# Patient Record
Sex: Male | Born: 1965
Health system: Southern US, Community
[De-identification: ages and names within clinical notes are randomized; demographics above are authoritative.]

## PROBLEM LIST (undated history)

## (undated) DIAGNOSIS — I472 Ventricular tachycardia, unspecified: Secondary | ICD-10-CM

## (undated) DIAGNOSIS — I428 Other cardiomyopathies: Secondary | ICD-10-CM

## (undated) DIAGNOSIS — I495 Sick sinus syndrome: Secondary | ICD-10-CM

## (undated) DIAGNOSIS — Z95810 Presence of automatic (implantable) cardiac defibrillator: Secondary | ICD-10-CM

## (undated) HISTORY — DX: Presence of automatic (implantable) cardiac defibrillator: Z95.810

## (undated) HISTORY — DX: Ventricular tachycardia: I47.2

## (undated) HISTORY — DX: Sick sinus syndrome: I49.5

## (undated) HISTORY — DX: Other cardiomyopathies: I42.8

## (undated) HISTORY — PX: CARDIAC DEFIBRILLATOR PLACEMENT: SHX171

## (undated) HISTORY — DX: Ventricular tachycardia, unspecified: I47.20

---

## 1999-05-24 ENCOUNTER — Encounter: Payer: Self-pay | Admitting: Emergency Medicine

## 1999-05-24 ENCOUNTER — Inpatient Hospital Stay (HOSPITAL_COMMUNITY): Admission: EM | Admit: 1999-05-24 | Discharge: 1999-05-26 | Payer: Self-pay | Admitting: Emergency Medicine

## 1999-05-25 ENCOUNTER — Encounter: Payer: Self-pay | Admitting: Internal Medicine

## 1999-06-07 ENCOUNTER — Ambulatory Visit (HOSPITAL_COMMUNITY): Admission: RE | Admit: 1999-06-07 | Discharge: 1999-06-07 | Payer: Self-pay | Admitting: Internal Medicine

## 1999-06-07 ENCOUNTER — Encounter: Payer: Self-pay | Admitting: Internal Medicine

## 1999-12-07 ENCOUNTER — Encounter: Payer: Self-pay | Admitting: Internal Medicine

## 1999-12-07 ENCOUNTER — Ambulatory Visit (HOSPITAL_COMMUNITY): Admission: RE | Admit: 1999-12-07 | Discharge: 1999-12-08 | Payer: Self-pay | Admitting: Internal Medicine

## 1999-12-08 ENCOUNTER — Encounter: Payer: Self-pay | Admitting: Internal Medicine

## 2000-02-12 ENCOUNTER — Encounter: Payer: Self-pay | Admitting: Internal Medicine

## 2000-02-12 ENCOUNTER — Ambulatory Visit (HOSPITAL_COMMUNITY): Admission: RE | Admit: 2000-02-12 | Discharge: 2000-02-12 | Payer: Self-pay | Admitting: Internal Medicine

## 2002-10-31 ENCOUNTER — Ambulatory Visit (HOSPITAL_BASED_OUTPATIENT_CLINIC_OR_DEPARTMENT_OTHER): Admission: RE | Admit: 2002-10-31 | Discharge: 2002-10-31 | Payer: Self-pay | Admitting: Internal Medicine

## 2004-11-06 ENCOUNTER — Ambulatory Visit: Payer: Self-pay | Admitting: Internal Medicine

## 2004-11-07 ENCOUNTER — Ambulatory Visit: Payer: Self-pay | Admitting: Internal Medicine

## 2005-03-26 ENCOUNTER — Ambulatory Visit: Payer: Self-pay

## 2005-04-24 ENCOUNTER — Ambulatory Visit (HOSPITAL_COMMUNITY): Admission: RE | Admit: 2005-04-24 | Discharge: 2005-04-24 | Payer: Self-pay | Admitting: Internal Medicine

## 2005-05-03 ENCOUNTER — Ambulatory Visit (HOSPITAL_COMMUNITY): Admission: RE | Admit: 2005-05-03 | Discharge: 2005-05-03 | Payer: Self-pay | Admitting: Internal Medicine

## 2005-07-03 ENCOUNTER — Ambulatory Visit: Payer: Self-pay | Admitting: Internal Medicine

## 2006-01-17 ENCOUNTER — Ambulatory Visit: Payer: Self-pay | Admitting: Internal Medicine

## 2006-04-04 ENCOUNTER — Ambulatory Visit: Payer: Self-pay | Admitting: Internal Medicine

## 2006-05-06 ENCOUNTER — Ambulatory Visit: Payer: Self-pay | Admitting: Internal Medicine

## 2006-05-06 LAB — CONVERTED CEMR LAB
BUN: 12 mg/dL (ref 6–23)
Basophils Absolute: 0 10*3/uL (ref 0.0–0.1)
Basophils Relative: 0.6 % (ref 0.0–1.0)
CO2: 29 meq/L (ref 19–32)
Calcium: 9.2 mg/dL (ref 8.4–10.5)
Chloride: 103 meq/L (ref 96–112)
Creatinine, Ser: 1.2 mg/dL (ref 0.4–1.5)
Eosinophils Absolute: 0.2 10*3/uL (ref 0.0–0.6)
Eosinophils Relative: 2.6 % (ref 0.0–5.0)
GFR calc Af Amer: 85 mL/min
GFR calc non Af Amer: 70 mL/min
Glucose, Bld: 98 mg/dL (ref 70–99)
HCT: 44.7 % (ref 39.0–52.0)
Hemoglobin: 15.6 g/dL (ref 13.0–17.0)
INR: 1 (ref 0.9–2.0)
Lymphocytes Relative: 23.5 % (ref 12.0–46.0)
MCHC: 34.8 g/dL (ref 30.0–36.0)
MCV: 96.9 fL (ref 78.0–100.0)
Monocytes Absolute: 0.6 10*3/uL (ref 0.2–0.7)
Monocytes Relative: 8.5 % (ref 3.0–11.0)
Neutro Abs: 4.3 10*3/uL (ref 1.4–7.7)
Neutrophils Relative %: 64.8 % (ref 43.0–77.0)
Platelets: 257 10*3/uL (ref 150–400)
Potassium: 4.2 meq/L (ref 3.5–5.1)
Prothrombin Time: 12.3 s (ref 10.0–14.0)
RBC: 4.62 M/uL (ref 4.22–5.81)
RDW: 12.3 % (ref 11.5–14.6)
Sodium: 140 meq/L (ref 135–145)
WBC: 6.7 10*3/uL (ref 4.5–10.5)
aPTT: 29.4 s (ref 26.5–36.5)

## 2006-05-14 ENCOUNTER — Ambulatory Visit: Payer: Self-pay | Admitting: Internal Medicine

## 2006-05-15 ENCOUNTER — Observation Stay (HOSPITAL_COMMUNITY): Admission: RE | Admit: 2006-05-15 | Discharge: 2006-05-15 | Payer: Self-pay | Admitting: Internal Medicine

## 2006-06-03 ENCOUNTER — Ambulatory Visit: Payer: Self-pay

## 2006-09-16 ENCOUNTER — Ambulatory Visit: Payer: Self-pay | Admitting: Internal Medicine

## 2006-10-17 ENCOUNTER — Ambulatory Visit: Payer: Self-pay | Admitting: Internal Medicine

## 2007-01-16 ENCOUNTER — Ambulatory Visit: Payer: Self-pay | Admitting: Internal Medicine

## 2007-04-17 ENCOUNTER — Ambulatory Visit: Payer: Self-pay | Admitting: Internal Medicine

## 2007-07-17 ENCOUNTER — Ambulatory Visit: Payer: Self-pay | Admitting: Internal Medicine

## 2007-11-12 ENCOUNTER — Ambulatory Visit: Payer: Self-pay | Admitting: Internal Medicine

## 2008-02-12 ENCOUNTER — Ambulatory Visit: Payer: Self-pay | Admitting: Internal Medicine

## 2008-05-07 ENCOUNTER — Encounter: Payer: Self-pay | Admitting: Internal Medicine

## 2008-05-13 ENCOUNTER — Ambulatory Visit: Payer: Self-pay | Admitting: Internal Medicine

## 2008-08-12 ENCOUNTER — Ambulatory Visit: Payer: Self-pay | Admitting: Internal Medicine

## 2008-08-19 ENCOUNTER — Encounter: Payer: Self-pay | Admitting: Internal Medicine

## 2008-10-27 ENCOUNTER — Encounter: Payer: Self-pay | Admitting: Internal Medicine

## 2008-10-27 ENCOUNTER — Ambulatory Visit: Payer: Self-pay

## 2008-10-27 ENCOUNTER — Telehealth (INDEPENDENT_AMBULATORY_CARE_PROVIDER_SITE_OTHER): Payer: Self-pay | Admitting: *Deleted

## 2008-10-27 DIAGNOSIS — I472 Ventricular tachycardia, unspecified: Secondary | ICD-10-CM | POA: Insufficient documentation

## 2008-10-27 DIAGNOSIS — R079 Chest pain, unspecified: Secondary | ICD-10-CM | POA: Insufficient documentation

## 2008-10-28 ENCOUNTER — Encounter: Payer: Self-pay | Admitting: Cardiology

## 2008-10-28 ENCOUNTER — Ambulatory Visit: Payer: Self-pay

## 2008-11-02 ENCOUNTER — Telehealth: Payer: Self-pay | Admitting: Internal Medicine

## 2009-02-10 ENCOUNTER — Ambulatory Visit: Payer: Self-pay | Admitting: Internal Medicine

## 2009-02-28 ENCOUNTER — Encounter: Payer: Self-pay | Admitting: Internal Medicine

## 2009-04-26 ENCOUNTER — Ambulatory Visit: Payer: Self-pay | Admitting: Internal Medicine

## 2009-04-28 ENCOUNTER — Telehealth (INDEPENDENT_AMBULATORY_CARE_PROVIDER_SITE_OTHER): Payer: Self-pay | Admitting: *Deleted

## 2009-05-03 ENCOUNTER — Encounter: Payer: Self-pay | Admitting: Internal Medicine

## 2009-05-06 ENCOUNTER — Telehealth: Payer: Self-pay | Admitting: Internal Medicine

## 2009-05-06 LAB — CONVERTED CEMR LAB
ALT: 12 U/L
AST: 14 U/L
Albumin: 4.4 g/dL
Alkaline Phosphatase: 93 U/L
Bilirubin, Direct: 0.1 mg/dL
TSH: 0.57 u[IU]/mL
Total Bilirubin: 0.4 mg/dL
Total Protein: 6.7 g/dL

## 2009-05-17 ENCOUNTER — Ambulatory Visit: Payer: Self-pay

## 2009-05-17 ENCOUNTER — Encounter: Payer: Self-pay | Admitting: Internal Medicine

## 2009-05-17 ENCOUNTER — Ambulatory Visit (HOSPITAL_COMMUNITY): Admission: RE | Admit: 2009-05-17 | Discharge: 2009-05-17 | Payer: Self-pay | Admitting: Internal Medicine

## 2009-05-17 ENCOUNTER — Ambulatory Visit: Payer: Self-pay | Admitting: Cardiology

## 2009-05-20 ENCOUNTER — Telehealth: Payer: Self-pay | Admitting: Internal Medicine

## 2009-05-27 ENCOUNTER — Telehealth: Payer: Self-pay | Admitting: Internal Medicine

## 2009-07-28 ENCOUNTER — Ambulatory Visit: Payer: Self-pay | Admitting: Internal Medicine

## 2009-07-29 ENCOUNTER — Encounter (INDEPENDENT_AMBULATORY_CARE_PROVIDER_SITE_OTHER): Payer: Self-pay | Admitting: *Deleted

## 2009-08-05 ENCOUNTER — Observation Stay (HOSPITAL_COMMUNITY): Admission: AD | Admit: 2009-08-05 | Discharge: 2009-08-07 | Payer: Self-pay | Admitting: Internal Medicine

## 2009-08-05 ENCOUNTER — Ambulatory Visit: Payer: Self-pay | Admitting: Cardiology

## 2009-08-07 ENCOUNTER — Encounter: Payer: Self-pay | Admitting: Internal Medicine

## 2009-08-09 ENCOUNTER — Telehealth: Payer: Self-pay | Admitting: Internal Medicine

## 2009-09-12 ENCOUNTER — Encounter (INDEPENDENT_AMBULATORY_CARE_PROVIDER_SITE_OTHER): Payer: Self-pay | Admitting: *Deleted

## 2009-10-27 ENCOUNTER — Ambulatory Visit: Payer: Self-pay | Admitting: Internal Medicine

## 2009-10-28 ENCOUNTER — Encounter (INDEPENDENT_AMBULATORY_CARE_PROVIDER_SITE_OTHER): Payer: Self-pay | Admitting: *Deleted

## 2010-01-02 ENCOUNTER — Encounter (INDEPENDENT_AMBULATORY_CARE_PROVIDER_SITE_OTHER): Payer: Self-pay | Admitting: *Deleted

## 2010-01-17 ENCOUNTER — Encounter (INDEPENDENT_AMBULATORY_CARE_PROVIDER_SITE_OTHER): Payer: Self-pay | Admitting: *Deleted

## 2010-02-09 ENCOUNTER — Ambulatory Visit: Payer: Self-pay | Admitting: Internal Medicine

## 2010-02-10 ENCOUNTER — Encounter: Payer: Self-pay | Admitting: Internal Medicine

## 2010-02-24 ENCOUNTER — Encounter (INDEPENDENT_AMBULATORY_CARE_PROVIDER_SITE_OTHER): Payer: Self-pay | Admitting: *Deleted

## 2010-03-01 ENCOUNTER — Ambulatory Visit: Admit: 2010-03-01 | Payer: Self-pay | Admitting: Internal Medicine

## 2010-03-02 ENCOUNTER — Ambulatory Visit: Admit: 2010-03-02 | Payer: Self-pay | Admitting: Internal Medicine

## 2010-03-02 ENCOUNTER — Telehealth: Payer: Self-pay | Admitting: Internal Medicine

## 2010-03-03 ENCOUNTER — Telehealth: Payer: Self-pay | Admitting: Internal Medicine

## 2010-03-06 ENCOUNTER — Ambulatory Visit: Admit: 2010-03-06 | Payer: Self-pay | Admitting: Internal Medicine

## 2010-03-10 ENCOUNTER — Encounter: Payer: Self-pay | Admitting: Internal Medicine

## 2010-03-28 NOTE — Cardiovascular Report (Signed)
Summary: Office Visit Remote   Office Visit Remote   Imported By: Roderic Ovens 11/21/2009 16:08:37  _____________________________________________________________________  External Attachment:    Type:   Image     Comment:   External Document

## 2010-03-28 NOTE — Progress Notes (Signed)
Summary: pt wife has questions about labs/**LM/nm  Phone Note Call from Patient Call back at [phone]   Caller: pt wife wendy Reason for Call: Lab or Test Results Summary of Call: pt wife is calling wanting to ask some questions about pt last lab work he had done. Initial call taken by: Faythe Ghee,  May 27, 2009 1:02 PM  Follow-up for Phone Call        [facility] Ollen Gross, RN, BSN  May 27, 2009 1:33 PM  RETURNING Blanca Friend  May 27, 2009 2:26 PM  Spoke with  pt's wife. She would like to have the echo results given to her. Pt's wife states pt said Dr. Graciela Husbands said his right side of his hear was not working. Echo resutls given  as reviewed per Dr. Marikay Alar. Wife very aprehensive about echo results. I advise to go to the internet look on web-MD she will find good information that may help her understand RV Cardiomyopathy.   Follow-up by: Ollen Gross, RN, BSN,  May 27, 2009 4:30 PM

## 2010-03-28 NOTE — Letter (Signed)
Summary: Device-Delinquent Phone Journalist, newspaper, Main Office  [address]   [place], [place] [address]   Phone: [phone]  Fax: [phone]     October 28, 2009 MRN: [id number]   [facility] [place] [address] [place], [place]  [address]   Dear Mr. Garbutt,  According to our records, you were scheduled for a device phone transmission on    10/27/09.                          .     We did not receive any results from this check.  If you transmitted on your scheduled day, please call us to help troubleshoot your system.  If you forgot to send your transmission, please send one upon receipt of this letter.  Thank you,  Altha Harm, LPN  October 28, 2009 3:47 PM  [facility]

## 2010-03-28 NOTE — Cardiovascular Report (Signed)
Summary: Office Visit   Office Visit   Imported By: Roderic Ovens 11/02/2008 14:56:25  _____________________________________________________________________  External Attachment:    Type:   Image     Comment:   External Document

## 2010-03-28 NOTE — Assessment & Plan Note (Signed)
Summary: defib check.gdt.amber   CC:  device check.  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 He has also had ventricular tachycardia storm and is currently managed on a combination of Cordarone and Betapace. He has tolerated these very well. He did result in iatrogenic hyperthyroidism.  He had a problem with intercurrent chest pain. this has not recurred. There is no significant positive shortness of breath or palpitations     Current Medications (verified): 1)  Nitroglycerin 0.4 Mg Subl (Nitroglycerin) .... One Tablet Under Tongue Every 5 Minutes As Needed For Chest Pain---May Repeat Times Three 2)  Betapace 80 Mg Tabs (Sotalol Hcl) .... Take 1/2 Tablet By Mouth Twice A Day 3)  Cordarone 200 Mg Tabs (Amiodarone Hcl) .... Take One Tablet Once Daily 4)  Aspir-Low 81 Mg Tbec (Aspirin) .... Take One Tablet Once Daily 5)  Tapazole 5 Mg Tabs (Methimazole) .... Take One Tablet Once Daily  Allergies (verified): 1)  ! Lidocaine  Past History:  Past Medical History: Last updated: 04/25/2009 Arrhythmogenic RV dysplasia ventricular tachycardia s/p ICD -- Guidant -- T175 Vitality treated hyperthyroidism  Past Surgical History: Last updated: 04/25/2009 ICD -- Guidant -- T175 Vitality  Family History: Last updated: 10/27/2008 CAD  Social History: Last updated: 04/25/2009 denies tobacco, ETOH, or drug use Married   Vital Signs:  Patient profile:   45 year old male Height:      69.5 inches Weight:      131 pounds BMI:     19.14 Pulse rate:   56 / minute Pulse rhythm:   regular BP sitting:   112 / 79  (right arm) Cuff size:   large  Vitals Entered By: Judithe Modest CMA (April 26, 2009 3:47 PM)  Physical Exam  General:  The patient was alert and oriented in no acute distress. HEENT Normal.  Neck veins were flat, carotids were brisk.  Lungs were clear.  Heart sounds  were regular without murmurs or gallops.  Abdomen was soft with active bowel sounds. There is no clubbing cyanosis or edema. Skin Warm and dry     ICD Specifications Following MD:  Sherryl Manges, MD     ICD Vendor:  [facility] [employer]     ICD Model Number:  T175     ICD Serial Number:  [id number] ICD DOI:  05/15/2006     ICD Implanting MD:  Sherryl Manges, MD  Lead 1:    Location: RV     DOI: 12/07/1999     Model #: [id number]     Serial #: [id number]     Status: active  Indications::  VT   ICD Follow Up Remote Check?  No Battery Voltage:  3.10 V     Charge Time:  6.9 seconds     Battery Est. Longevity:  BOL Underlying rhythm:  SR   ICD Device Measurements Right Ventricle:  Amplitude: 5.0 mV, Impedance: 640 ohms, Threshold: 1.4 V at 0.4 msec Shock Impedance: 44 ohms   Episodes Coumadin:  No Shock:  0     ATP:  0     Nonsustained:  0     Ventricular Pacing:  1%  Brady Parameters Mode VVI     Lower Rate Limit:  40      Tachy Zones VF:  240     VT:  200     VT1:  160     Next Remote Date:  07/28/2009  Next Cardiology Appt Due:  04/27/2010 Tech Comments:  No parameter changes.  Device function normal.  Latitude transmissions every 3 months. ROV 1 year with Dr. Graciela Husbands. Altha Harm, LPN  April 26, 8293 3:59 PM   Impression & Recommendations:  Problem # 1:  VENTRICULAR TACHYCARDIA (ICD-427.1) No recurrent ventricular tachycardia will continue on his current medications. We will check his amiodarone surveillance laboratories today. They will need to be faxed to Dr. Jacky Kindle His updated medication list for this problem includes:    Nitroglycerin 0.4 Mg Subl (Nitroglycerin) ..... One tablet under tongue every 5 minutes as needed for chest pain---may repeat times three    Betapace 80 Mg Tabs (Sotalol hcl) .Marland Kitchen... Take 1/2 tablet by mouth twice a day    Cordarone 200 Mg Tabs (Amiodarone hcl) .Marland Kitchen... Take one tablet once daily    Aspir-low 81 Mg Tbec (Aspirin) .Marland Kitchen... Take one tablet once daily  Problem  # 2:  CARDIOMYOPATHY,ARRHYTHMOGENIC RIGHT VENTRICULAR (ICD-425.4)  his ARB D. is relatively stable. I think is probably worth however get an echo to reevaluate RV size and LV function His updated medication list for this problem includes:    Nitroglycerin 0.4 Mg Subl (Nitroglycerin) ..... One tablet under tongue every 5 minutes as needed for chest pain---may repeat times three    Betapace 80 Mg Tabs (Sotalol hcl) .Marland Kitchen... Take 1/2 tablet by mouth twice a day    Cordarone 200 Mg Tabs (Amiodarone hcl) .Marland Kitchen... Take one tablet once daily    Aspir-low 81 Mg Tbec (Aspirin) .Marland Kitchen... Take one tablet once daily  His updated medication list for this problem includes:    Nitroglycerin 0.4 Mg Subl (Nitroglycerin) ..... One tablet under tongue every 5 minutes as needed for chest pain---may repeat times three    Betapace 80 Mg Tabs (Sotalol hcl) .Marland Kitchen... Take 1/2 tablet by mouth twice a day    Cordarone 200 Mg Tabs (Amiodarone hcl) .Marland Kitchen... Take one tablet once daily    Aspir-low 81 Mg Tbec (Aspirin) .Marland Kitchen... Take one tablet once daily  Orders: Echocardiogram (Echo)  Problem # 3:  ICD - IN SITU - GUIDANT -- T175 VITALITY (ICD-V45.02) Device parameters and data were reviewed and no changes were made  Other Orders: TLB-TSH (Thyroid Stimulating Hormone) (84443-TSH) TLB-Hepatic/Liver Function Pnl (80076-HEPATIC)  Patient Instructions: 1)  Your physician recommends that you HAVE LABS TODAY: TSH, LFT  2)  Your physician recommends that you schedule a follow-up appointment in: 12 MONTHS WITH DR. Graciela Husbands 3)  Your physician has requested that you have an echocardiogram.  Echocardiography is a painless test that uses sound waves to create images of your heart. It provides your doctor with information about the size and shape of your heart and how well your heart's chambers and valves are working.  This procedure takes approximately one hour. There are no restrictions for this procedure.

## 2010-03-28 NOTE — Cardiovascular Report (Signed)
Summary: Office Visit Remote   Office Visit Remote   Imported By: Roderic Ovens 09/06/2009 13:35:05  _____________________________________________________________________  External Attachment:    Type:   Image     Comment:   External Document

## 2010-03-28 NOTE — Cardiovascular Report (Signed)
Summary: Office Visit Remote  Office Visit Remote   Imported By: Roderic Ovens 09/25/2008 10:02:56  _____________________________________________________________________  External Attachment:    Type:   Image     Comment:   External Document

## 2010-03-28 NOTE — Letter (Signed)
Summary: Device-Delinquent Phone Transmission  [facility]  [address]   [place], [place] [address]   Phone: [phone]  Fax: [phone]     July 29, 2009 MRN: [id number]   [facility] [place] [address] [place], [place]  [address]   Dear Ernest Morgan,  According to our records, you were scheduled for a device phone transmission on                              07/28/09.     We did not receive any results from this check.  If you transmitted on your scheduled day, please call us to help troubleshoot your system.  If you forgot to send your transmission, please send one upon receipt of this letter.  Thank you,  Milana Na, EMT-P  July 29, 2009 3:21 PM  [facility]

## 2010-03-28 NOTE — Progress Notes (Signed)
Summary: Nuclear pre-procedure  Medications Added BETAPACE 80 MG TABS (SOTALOL HCL) Take 1 tablet by mouth twice a day CORDARONE 200 MG TABS (AMIODARONE HCL)        Phone Note Outgoing Call Call back at [facility] Phone [phone] Call back at Work Phone [phone]   Call placed by: [employer], CMA,  October 27, 2008 4:11 PM Call placed to: Patient Summary of Call: Left message with work number for patient to call, no answer at home number.    New/Updated Medications: BETAPACE 80 MG TABS (SOTALOL HCL) Take 1 tablet by mouth twice a day CORDARONE 200 MG TABS (AMIODARONE HCL)

## 2010-03-28 NOTE — Letter (Signed)
Summary: Remote Device Check  [employer], Main Office  [address]   [place], [place] [address]   Phone: [phone]  Fax: [phone]     February 28, 2009 MRN: [id number]   [facility] [place] [address] [place], [place]  [address]   Dear Mr. Closs,   Your remote transmission was recieved and reviewed by your physician.  All diagnostics were within normal limits for you.    ___X___Your next office visit is scheduled for:     MARCH 2011 WITH DR Graciela Husbands. Please call our office to schedule an appointment.    Sincerely,  Proofreader

## 2010-03-28 NOTE — Cardiovascular Report (Signed)
Summary: Office Visit Remote   Office Visit Remote   Imported By: Roderic Ovens 03/03/2009 12:51:42  _____________________________________________________________________  External Attachment:    Type:   Image     Comment:   External Document

## 2010-03-28 NOTE — Progress Notes (Signed)
Summary: test resuts  Phone Note Call from Patient Call back at Home Phone [phone] Call back at [phone]   Caller: Patient Reason for Call: Talk to Nurse, Lab or Test Results Initial call taken by: Lorne Skeens,  May 06, 2009 9:33 AM  Follow-up for Phone Call        Spoke with pt to let him know results of TSH & LFT done on 04/26/09. Follow-up by: Minerva Areola, RN, BSN,  May 06, 2009 9:50 AM

## 2010-03-28 NOTE — Letter (Signed)
Summary: Work Writer, Main Office  [address]   [place], [place] [address]   Phone: [phone]  Fax: [phone]     October 27, 2008    Ernest Morgan   The above named patient had an unplanned medical visit today at 0900 am in my office. He will need to be here again tomorrow (10/28/2008) for a repeat stress test. If there is any questions feel free to call my office at [phone]  Please take this into consideration when reviewing the time away from work.      Sincerely yours,  [facility] Doloris Hall. Sherral Hammers, RN BSN Hillis Range, MD

## 2010-03-28 NOTE — Letter (Signed)
Summary: Appointment - Missed  [facility], Main Office  [address]   [place], [place] [address]   Phone: [phone]  Fax: [phone]     September 12, 2009 MRN: [id number]   [facility] [address] [place], [place]  [address]   Dear Ernest Morgan,  Our records indicate you missed your appointment on 08/30/09 with Dr Graciela Husbands. It is very important that we reach you to reschedule this appointment. We look forward to participating in your health care needs. Please contact us at the number listed above at your earliest convenience to reschedule this appointment.     Sincerely,   Ruel Favors Scheduling Team

## 2010-03-28 NOTE — Progress Notes (Signed)
Summary: QUESTION ABOUT HAVING STRESS TEST-LM  Phone Note Call from Patient Call back at [phone]   Caller: Spouse/ [facility] Summary of Call: PT WIFE HAVE QUESTION ABOUT PT HAVING A STRESS TEST Initial call taken by: Judie Grieve,  August 09, 2009 9:37 AM  Follow-up for Phone Call        [facility] for wife to call me back Dennis Bast, RN, BSN  August 09, 2009 10:38 AM Called patient's wife and left message on machine to call back with her questions  Sander Nephew, RN  Spoke with pt's wife regarding pt's  f/u appointment. According to wife pt. was in the hospital with chest pain on 6/10 to the 12th. On pt's d/c instructions reads pt. needs to see Dr. Marikay Alar in 2 to 3 weeks. An appointment was made for pt. on July 5th at 9:00 AM. Wife aware. Follow-up by: Ollen Gross, RN, BSN,  August 10, 2009 5:01 PM

## 2010-03-28 NOTE — Cardiovascular Report (Signed)
Summary: Office Visit   Office Visit   Imported By: Roderic Ovens 05/06/2009 11:09:14  _____________________________________________________________________  External Attachment:    Type:   Image     Comment:   External Document

## 2010-03-28 NOTE — Letter (Signed)
Summary: [facility]  [facility]   Imported By: Marylou Mccoy 08/26/2009 15:35:44  _____________________________________________________________________  External Attachment:    Type:   Image     Comment:   External Document

## 2010-03-28 NOTE — Progress Notes (Signed)
Summary: test results  Phone Note Call from Patient Call back at Home Phone [phone] Call back at [phone]   Caller: Patient Reason for Call: Talk to Nurse, Lab or Test Results Details for Reason: stress test  Initial call taken by: Lorne Skeens,  November 02, 2008 2:20 PM  Follow-up for Phone Call        pt aware of stress test results Dennis Bast, RN, BSN  November 02, 2008 3:40 PM

## 2010-03-28 NOTE — Letter (Signed)
Summary: Appointment - Missed  [facility], Main Office  [address]   [place], [place] [address]   Phone: [phone]  Fax: [phone]     January 02, 2010 MRN: [id number]   [facility] [address] [place], [place]  [address]   Dear Mr. Kush,  Our records indicate you missed your appointment on 01-02-2010 with Dr.  Graciela Husbands.                                    It is very important that we reach you to reschedule this appointment. We look forward to participating in your health care needs. Please contact us at the number listed above at your earliest convenience to reschedule this appointment.     Sincerely,      Lorne Skeens [facility] Scheduling Team

## 2010-03-28 NOTE — Letter (Signed)
Summary: Work Writer, Main Office  [address]   [place], [place] [address]   Phone: [phone]  Fax: [phone]     October 27, 2008    Ernest Morgan   The above named patient had an unexpected medical visit today at 0900am for chest pain in my office. His daughter [name] was with him here.  Please take this into consideration when reviewing the time away from work/school.      Sincerely yours,  [facility] Doloris Hall. Sherral Hammers, RN BSN Hillis Range, MD

## 2010-03-28 NOTE — Letter (Signed)
Summary: Appointment - Missed  [facility], Main Office  [address]   [place], [place] [address]   Phone: [phone]  Fax: [phone]     January 17, 2010 MRN: [id number]   [facility] [place] [address] [place], [place]  [address]   Dear Ernest Morgan,  Our records indicate you missed your appointment on 01-02-2010 with  Dr. Graciela Husbands.  It is very important that we reach you to reschedule this appointment. We look forward to participating in your health care needs. Please contact us at the number listed above at your earliest convenience to reschedule this appointment.     Sincerely,   Lorne Skeens [facility] Scheduling Team

## 2010-03-28 NOTE — Progress Notes (Signed)
Summary: echo results/LM for CB/JR  Phone Note Outgoing Call Call back at [facility] Phone [phone]   Call placed by: Gypsy Balsam RN BSN,  May 20, 2009 4:46 PM Call placed to: Patient Summary of Call: Attempted to call patient to discuss echo results.  No answer. Gypsy Balsam RN BSN  May 20, 2009 4:46 PM   Follow-up for Phone Call        Patient returned call [phone] work until 5pm or [phone] home.   per pt calling to check on status of his echo report. [phone] Lorne Skeens  May 25, 2009 10:40 AM  Follow-up by: Burnard Leigh,  May 23, 2009 4:49 PM  Additional Follow-up for Phone Call Additional follow up Details #1::        LM ar work for call back.  Additional Follow-up by: Suzan Garibaldi RN    Additional Follow-up for Phone Call Additional follow up Details #2::    Patient aware of results of echo. Follow-up by: Suzan Garibaldi RN

## 2010-03-28 NOTE — Progress Notes (Signed)
Summary: Lab Results  Phone Note Outgoing Call   Call placed by: Duncan Dull, RN, BSN,  April 28, 2009 2:45 PM Call placed to: Patient Summary of Call: Called patient and left message on machine  To discuss labs Initial call taken by: Duncan Dull, RN, BSN,  April 28, 2009 2:46 PM  Follow-up for Phone Call        No answer and No machine at [phone]. Duncan Dull, RN, BSN  May 02, 2009 2:11 PM   Additional Follow-up for Phone Call Additional follow up Details #1::        No answer. Letter mailed  Additional Follow-up by: Duncan Dull, RN, BSN,  May 03, 2009 3:29 PM

## 2010-03-28 NOTE — Assessment & Plan Note (Signed)
Summary: Cardiology Nuclear Study  Nuclear Med Background Indications for Stress Test: Evaluation for Ischemia   History: Defibrillator, Heart Catheterization, Myocardial Perfusion Study  History Comments: 80's Cath:OK per patient; '01 GMW:NUUVOZ, Not gated); '01 AICD; '08 AICD generator change  Symptoms: Chest Pain, Dizziness, Light-Headedness, Nausea    Nuclear Pre-Procedure Cardiac Risk Factors: Family History - CAD Caffeine/Decaff Intake: none NPO After: 7:30 PM Lungs: Clear IV 0.9% NS with Angio Cath: 22g     IV Site: (R) AC IV Started by: Irean Hong RN Chest Size (in) 38     Height (in): 69.5 Weight (lb): 136 BMI: 19.87  Nuclear Med Study 1 or 2 day study:  1 day     Stress Test Type:  Adenosine Reading MD:  Willa Rough, MD     Referring MD:  Hillis Range, MD Resting Radionuclide:  Technetium 27m Tetrofosmin     Resting Radionuclide Dose:  11.0 mCi  Stress Radionuclide:  Technetium 36m Tetrofosmin     Stress Radionuclide Dose:  33.0 mCi   Stress Protocol      Max HR:  75 bpm Max Systolic BP: 115 mm HgRate Pressure Product:  8625 Dose of Adenosine:  34.6 mg    Stress Test Technologist:  [employer]a [employer] CMA-N     Nuclear Technologist:  Domenic Polite CNMT  Rest Procedure  Myocardial perfusion imaging was performed at rest 45 minutes following the intraveneous administration of Myoview Technetium 50m Tetrofosmin.  Stress Procedure  The patient received IV adenosine at 140 mcg/kg/min for 4 minutes. There were no significant changes with infusion.  He did c/o chest tightness with infusion.  Myoview was injected at the 2 minute mark and quantitative spect images were obtained after a 45 minute delay.  QPS Raw Data Images:  Normal; no motion artifact; normal heart/lung ratio. Stress Images:  There is normal uptake in all areas. Rest Images:  Normal homogeneous uptake in all areas of the myocardium. Subtraction (SDS):  No evidence of ischemia. Transient Ischemic  Dilatation:  1.01  (Normal <1.22)  Lung/Heart Ratio:  .11  (Normal <0.45)  Quantitative Gated Spect Images QGS EDV:  77 ml QGS ESV:  30 ml QGS EF:  61 % QGS cine images:  Normal wall motion  Findings Normal nuclear study      Overall Impression  Exercise Capacity: Adenosine study with no exercise. BP Response: Normal blood pressure response. Clinical Symptoms: chest tight ECG Impression: No significant ST segment change suggestive of ischemia. Overall Impression: Normal stress nuclear study.  Appended Document: Cardiology Nuclear Study pt aware of results

## 2010-03-28 NOTE — Letter (Signed)
Summary: Remote Device Check  [employer], Main Office  [address]   [place], [place] [address]   Phone: [phone]  Fax: [phone]     August 19, 2008 MRN: [id number]   [facility] Ped[place] [address] [place], [place]  [address]   Dear Mr. Wesch,   Your remote transmission was recieved and reviewed by your physician.  All diagnostics were within normal limits for you.   ____X__Your next office visit is scheduled for:  September with Dr. Graciela Husbands. Please call our office to schedule an appointment.    Sincerely,  Proofreader

## 2010-03-28 NOTE — Letter (Signed)
Summary: Results Follow-up  [employer], Main Office  [address]   [place], [place] [address]   Phone: [phone]  Fax: [phone]     May 03, 2009 MRN: [id number]   [facility] [place] [address] [place], [place]  [address]   Dear Mr. Nay,  We have received the results from your recent tests and have been unable to contact you.  Please call our office at the number listed above so that Dr. Graciela Husbands or his nurse may review the results with you.    Thank you,  [facility] Melanie M. Sherral Hammers, Charity fundraiser, BSN

## 2010-03-28 NOTE — Assessment & Plan Note (Signed)
Summary: add on pacer check  Medications Added NITROGLYCERIN 0.4 MG SUBL (NITROGLYCERIN) One tablet under tongue every 5 minutes as needed for chest pain---may repeat times three PRILOSEC 20 MG CPDR (OMEPRAZOLE) Take one tablet daily        Visit Type:  Pacemaker check  CC:  chest pain.  History of Present Illness: [name] is a pleasant 45 yo WM with a history of ARVD s/p ICD implantation who presents today for further evaluation of atypical chest pain.  He reports being in good health until this AM, but reports significant family stress over the past few days.  He started having a dull pain in the middle of his chest, not radiating around 8am today.  He denies associated SOB, N/V, arm/jaw pain, or palpitations.  He reports having a "queezy" feeling in his stomch, no other complaints.  Over the coars of the morning, his pain has resolved.  Presently he is painfree.  Past History:  Past Medical History: Arrhythmogenic RV dysplasia ventricular tachycardia s/p ICD treated hyperthyroidism  Family History: CAD  Social History: denies tobacco, ETOH, or drug use  Review of Systems       All systems are reviewed and negative except as listed in the HPI.   Vital Signs:  Patient profile:   45 year old male Pulse rate:   52 / minute Pulse rhythm:   regular BP sitting:   128 / 91  (left arm)  Vitals Entered By: Duncan Dull, RN, BSN (October 27, 2008 9:37 AM)  Physical Exam  General:  Well developed, well nourished, in no acute distress. Head:  normocephalic and atraumatic Eyes:  PERRLA/EOM intact; conjunctiva and lids normal. Nose:  no deformity, discharge, inflammation, or lesions Mouth:  Teeth, gums and palate normal. Oral mucosa normal. Neck:  Neck supple, no JVD. No masses, thyromegaly or abnormal cervical nodes. Lungs:  Clear bilaterally to auscultation and percussion. Heart:  Non-displaced PMI, chest non-tender; regular rate and rhythm, S1, S2 without murmurs, rubs  or gallops. Carotid upstroke normal, no bruit. Normal abdominal aortic size, no bruits. Femorals normal pulses, no bruits. Pedals normal pulses. No edema, no varicosities. Abdomen:  Bowel sounds positive; abdomen soft and non-tender without masses, organomegaly, or hernias noted. No hepatosplenomegaly. Msk:  Back normal, normal gait. Muscle strength and tone normal. Pulses:  pulses normal in all 4 extremities Extremities:  No clubbing or cyanosis. Neurologic:  Alert and oriented x 3. Skin:  Intact without lesions or rashes. Cervical Nodes:  no significant adenopathy Psych:  Normal affect.   EKG  Procedure date:  10/27/2008  Findings:      sinus bradycardia, nonspecific ST/T changes, unchanges from prior   ICD Specifications ICD Vendor:  [employer]     ICD Model Number:  T175     ICD Serial Number:  [id number] ICD DOI:  05/15/2006     ICD Implanting MD:  Sherryl Manges, MD  Lead 1:    Location: RV     DOI: 12/07/1999     Model #: [id number]     Serial #: [id number]     Status: active  Indications::  VT   Impression & Recommendations:  Problem # 1:  CHEST PAIN UNSPECIFIED (ICD-786.50) The patient presents with atypical chest pain this am, now resolved.  He feels that his pain is likely due to "stress".  He is low to moderate risk for  ischemia.  I would therefore recommend that we proceed with GXT myoview.  As he has chronic  EKG changes, I think that myoview will be beneficial.  The patient will continue daily aspirin and we have prescribed as needed slNTG today. He is instructed to go to the ER if he develops recurrent CP not relieved with slNTG.  I will also start the patient on a PPI for possible gastritis given recent worsening stress.  His updated medication list for this problem includes:    Nitroglycerin 0.4 Mg Subl (Nitroglycerin) ..... One tablet under tongue every 5 minutes as needed for chest pain---may repeat times three  Orders: Nuclear Stress Test (Nuc Stress  Test)  Problem # 2:  VENTRICULAR TACHYCARDIA (ICD-427.1) The patient has a history of VT.  Interrogation of his device today reveals no recent VT.  I do not think that the above chest pain was related to his ARVD or VT.  No changes were made to his device or antiarrhythmic medicines.  Patient Instructions: 1)  Your physician has requested that you have an exercise stress myoview.  For further information please visit [email or web address]  Please follow instruction sheet, as given. 2)  Your physician has recommended you make the following change in your medication: Take Nitroglycerin as needed per directions. Start Prilosec 20mg  daily. Please make sure to continue taking your ASA as directed and follow up with Dr. Graciela Husbands as scheduled.  Prescriptions: PRILOSEC 20 MG CPDR (OMEPRAZOLE) Take one tablet daily  #30 x 0   Entered by:   Duncan Dull, RN, BSN   Authorized by:   Hillis Range, MD   Signed by:   Duncan Dull, RN, BSN on 10/27/2008   Method used:   Print then Give to Patient   RxID:   [id number] NITROGLYCERIN 0.4 MG SUBL (NITROGLYCERIN) One tablet under tongue every 5 minutes as needed for chest pain---may repeat times three  #30 x 2   Entered by:   Duncan Dull, RN, BSN   Authorized by:   Hillis Range, MD   Signed by:   Duncan Dull, RN, BSN on 10/27/2008   Method used:   Print then Give to Patient   RxID:   [id number]

## 2010-03-30 NOTE — Progress Notes (Signed)
Summary: question re procedure  Phone Note Call from Patient Call back at Home Phone [phone] Call back at [phone]   Caller: Spouse/wendy Reason for Call: Talk to Nurse Summary of Call: has question re pt procedure.  Initial call taken by: Roe Coombs,  March 02, 2010 8:05 AM  Follow-up for Phone Call        lmfcb Claris Gladden, RN, BSN 03/02/10 1012 pt home number disconnected. pt spouse left work 5-10 mins ago. will try again in am. Claris Gladden, RN, BSN 03/02/10 [phone]

## 2010-03-30 NOTE — Letter (Signed)
Summary: Device-Delinquent Phone Journalist, newspaper, Main Office  [address]   [place], [place] [address]   Phone: [phone]  Fax: [phone]     February 10, 2010 MRN: [id number]   [facility] [address] [place], [place]  [address]   Dear Mr. Losey,  According to our records, you were scheduled for a device phone transmission on 02-09-2010.     We did not receive any results from this check.  If you transmitted on your scheduled day, please call us to help troubleshoot your system.  If you forgot to send your transmission, please send one upon receipt of this letter.  Thank you,   Architectural technologist Device Clinic

## 2010-03-30 NOTE — Letter (Signed)
Summary: Device-Delinquent Phone Journalist, newspaper, Main Office  [address]   [place], [place] [address]   Phone: [phone]  Fax: [phone]     February 24, 2010 MRN: [id number]   [facility] [address] [place], [place]  [address]   Dear Mr. Espina,  According to our records, you were scheduled for a device phone transmission on 02-09-2010.     We did not receive any results from this check.  If you transmitted on your scheduled day, please call us to help troubleshoot your system.  If you forgot to send your transmission, please send one upon receipt of this letter.  Thank you,  Vella Kohler  February 24, 2010 2:18 PM   [facility] certified

## 2010-03-30 NOTE — Cardiovascular Report (Signed)
Summary: Certified Letter Signed - Other (not doing f/u)  Certified Letter Signed - Other (not doing f/u)   Imported By: Debby Freiberg 03/16/2010 11:04:39  _____________________________________________________________________  External Attachment:    Type:   Image     Comment:   External Document

## 2010-03-30 NOTE — Progress Notes (Signed)
Summary: returning you call  Phone Note Call from Patient Call back at [phone]   Caller: Spouse/wendy Reason for Call: Talk to Nurse Summary of Call: pt calling back re procedure Initial call taken by: Roe Coombs,  March 03, 2010 10:03 AM  Follow-up for Phone Call        lmfcb. Claris Gladden, RN, BSN 03/03/10 1022 spoke to pt spouse and explained that needed a blood sample to send for genetic testing and nothing will be done before the company speaks with them. pt expressed understanding.  Follow-up by: Claris Gladden RN,  March 03, 2010 11:16 AM

## 2010-05-15 ENCOUNTER — Encounter: Payer: Self-pay | Admitting: Internal Medicine

## 2010-05-15 LAB — CARDIAC PANEL(CRET KIN+CKTOT+MB+TROPI)
CK, MB: 0.5 ng/mL (ref 0.3–4.0)
Relative Index: INVALID (ref 0.0–2.5)
Total CK: 44 U/L (ref 7–232)
Troponin I: <0.01 ng/mL (ref 0.00–0.06)

## 2010-05-15 LAB — URINALYSIS, ROUTINE W REFLEX MICROSCOPIC
Bilirubin Urine: NEGATIVE
Glucose, UA: NEGATIVE mg/dL
Hgb urine dipstick: NEGATIVE
Ketones, ur: NEGATIVE mg/dL
Nitrite: NEGATIVE
Protein, ur: NEGATIVE mg/dL
Specific Gravity, Urine: 1.008 (ref 1.005–1.030)
Urobilinogen, UA: 1 mg/dL (ref 0.0–1.0)
pH: 7.5 (ref 5.0–8.0)

## 2010-05-15 LAB — CBC
HCT: 37.6 % — ABNORMAL LOW (ref 39.0–52.0)
HCT: 39.7 % (ref 39.0–52.0)
HCT: 40.2 % (ref 39.0–52.0)
Hemoglobin: 12.8 g/dL — ABNORMAL LOW (ref 13.0–17.0)
Hemoglobin: 13.8 g/dL (ref 13.0–17.0)
Hemoglobin: 13.9 g/dL (ref 13.0–17.0)
MCHC: 34 g/dL (ref 30.0–36.0)
MCHC: 34.4 g/dL (ref 30.0–36.0)
MCHC: 35 g/dL (ref 30.0–36.0)
MCV: 100 fL (ref 78.0–100.0)
MCV: 100 fL (ref 78.0–100.0)
MCV: 99.7 fL (ref 78.0–100.0)
Platelets: 146 10*3/uL — ABNORMAL LOW (ref 150–400)
Platelets: 152 10*3/uL (ref 150–400)
Platelets: 193 10*3/uL (ref 150–400)
RBC: 3.76 MIL/uL — ABNORMAL LOW (ref 4.22–5.81)
RBC: 3.97 MIL/uL — ABNORMAL LOW (ref 4.22–5.81)
RBC: 4.03 MIL/uL — ABNORMAL LOW (ref 4.22–5.81)
RDW: 12.8 % (ref 11.5–15.5)
RDW: 12.9 % (ref 11.5–15.5)
RDW: 13 % (ref 11.5–15.5)
WBC: 4.6 10*3/uL (ref 4.0–10.5)
WBC: 4.6 10*3/uL (ref 4.0–10.5)
WBC: 6.3 10*3/uL (ref 4.0–10.5)

## 2010-05-15 LAB — T4, FREE: Free T4: 1.2 ng/dL (ref 0.80–1.80)

## 2010-05-15 LAB — CK TOTAL AND CKMB (NOT AT ARMC)
CK, MB: 0.4 ng/mL (ref 0.3–4.0)
CK, MB: 6.2 ng/mL (ref 0.3–4.0)
Relative Index: 2.8 — ABNORMAL HIGH (ref 0.0–2.5)
Relative Index: INVALID (ref 0.0–2.5)
Total CK: 225 U/L (ref 7–232)
Total CK: 37 U/L (ref 7–232)

## 2010-05-15 LAB — COMPREHENSIVE METABOLIC PANEL
ALT: 10 U/L (ref 0–53)
ALT: 13 U/L (ref 0–53)
AST: 11 U/L (ref 0–37)
AST: 16 U/L (ref 0–37)
Albumin: 3.4 g/dL — ABNORMAL LOW (ref 3.5–5.2)
Albumin: 4.2 g/dL (ref 3.5–5.2)
Alkaline Phosphatase: 69 U/L (ref 39–117)
Alkaline Phosphatase: 71 U/L (ref 39–117)
BUN: 10 mg/dL (ref 6–23)
BUN: 6 mg/dL (ref 6–23)
CO2: 22 mEq/L (ref 19–32)
CO2: 26 mEq/L (ref 19–32)
Calcium: 8.7 mg/dL (ref 8.4–10.5)
Calcium: 9.3 mg/dL (ref 8.4–10.5)
Chloride: 107 mEq/L (ref 96–112)
Chloride: 107 mEq/L (ref 96–112)
Creatinine, Ser: 1.05 mg/dL (ref 0.4–1.5)
Creatinine, Ser: 1.14 mg/dL (ref 0.4–1.5)
GFR calc Af Amer: >60 mL/min (ref >60)
GFR calc Af Amer: >60 mL/min (ref >60)
GFR calc non Af Amer: >60 mL/min (ref >60)
GFR calc non Af Amer: >60 mL/min (ref >60)
Glucose, Bld: 92 mg/dL (ref 70–99)
Glucose, Bld: 97 mg/dL (ref 70–99)
Potassium: 3.8 mEq/L (ref 3.5–5.1)
Potassium: 4.6 mEq/L (ref 3.5–5.1)
Sodium: 135 mEq/L (ref 135–145)
Sodium: 139 mEq/L (ref 135–145)
Total Bilirubin: 0.5 mg/dL (ref 0.3–1.2)
Total Bilirubin: 1.1 mg/dL (ref 0.3–1.2)
Total Protein: 5.4 g/dL — ABNORMAL LOW (ref 6.0–8.3)
Total Protein: 6.2 g/dL (ref 6.0–8.3)

## 2010-05-15 LAB — TROPONIN I
Troponin I: <0.01 ng/mL (ref 0.00–0.06)
Troponin I: <0.01 ng/mL (ref 0.00–0.06)
Troponin I: <0.01 ng/mL (ref 0.00–0.06)

## 2010-05-15 LAB — TSH: TSH: 0.62 u[IU]/mL (ref 0.350–4.500)

## 2010-05-15 LAB — D-DIMER, QUANTITATIVE: D-Dimer, Quant: <0.22 ug/mL-FEU (ref 0.00–0.48)

## 2010-05-15 LAB — MRSA PCR SCREENING: MRSA by PCR: NEGATIVE

## 2010-05-15 LAB — HEPARIN LEVEL (UNFRACTIONATED)
Heparin Unfractionated: 0.64 IU/mL (ref 0.30–0.70)
Heparin Unfractionated: 0.73 IU/mL — ABNORMAL HIGH (ref 0.30–0.70)

## 2010-06-01 ENCOUNTER — Other Ambulatory Visit: Payer: Self-pay | Admitting: Internal Medicine

## 2010-06-08 ENCOUNTER — Emergency Department (HOSPITAL_COMMUNITY)
Admission: EM | Admit: 2010-06-08 | Discharge: 2010-06-09 | Disposition: A | Discharge status: Home / Self Care | Payer: BLUE CROSS/BLUE SHIELD | Attending: Emergency Medicine | Admitting: Emergency Medicine

## 2010-06-08 ENCOUNTER — Emergency Department (HOSPITAL_COMMUNITY): Payer: BLUE CROSS/BLUE SHIELD

## 2010-06-08 DIAGNOSIS — N201 Calculus of ureter: Secondary | ICD-10-CM | POA: Insufficient documentation

## 2010-06-08 DIAGNOSIS — I499 Cardiac arrhythmia, unspecified: Secondary | ICD-10-CM | POA: Insufficient documentation

## 2010-06-08 DIAGNOSIS — R109 Unspecified abdominal pain: Secondary | ICD-10-CM | POA: Insufficient documentation

## 2010-06-08 DIAGNOSIS — Z79899 Other long term (current) drug therapy: Secondary | ICD-10-CM | POA: Insufficient documentation

## 2010-06-08 LAB — URINE MICROSCOPIC-ADD ON

## 2010-06-08 LAB — URINALYSIS, ROUTINE W REFLEX MICROSCOPIC
Glucose, UA: NEGATIVE mg/dL
Ketones, ur: NEGATIVE mg/dL
Leukocytes, UA: NEGATIVE
Nitrite: NEGATIVE
Protein, ur: 30 mg/dL — AB
Specific Gravity, Urine: 1.021 (ref 1.005–1.030)
Urobilinogen, UA: 1 mg/dL (ref 0.0–1.0)
pH: 7 (ref 5.0–8.0)

## 2010-06-08 LAB — POCT I-STAT, CHEM 8
BUN: 14 mg/dL (ref 6–23)
Calcium, Ion: 1.13 mmol/L (ref 1.12–1.32)
Chloride: 104 mEq/L (ref 96–112)
Creatinine, Ser: 1.3 mg/dL (ref 0.4–1.5)
Glucose, Bld: 146 mg/dL — ABNORMAL HIGH (ref 70–99)
HCT: 41 % (ref 39.0–52.0)
Hemoglobin: 13.9 g/dL (ref 13.0–17.0)
Potassium: 3.6 mEq/L (ref 3.5–5.1)
Sodium: 139 mEq/L (ref 135–145)
TCO2: 24 mmol/L (ref 0–100)

## 2010-06-09 ENCOUNTER — Encounter (HOSPITAL_COMMUNITY): Payer: Self-pay

## 2010-06-13 ENCOUNTER — Encounter: Payer: Self-pay | Admitting: Internal Medicine

## 2010-07-11 NOTE — Letter (Signed)
September 16, 2006    Geoffry Paradise, M.D.  [address]  [place], [place] [address]   RE:  LAYNE, DILAURO  MRN:  [id number]  /  DOB:  04/15/1965   Dear Clide Cliff:   Mr. Burkhalter comes in today for followup for arrhythmogenic RV dysplasia.  He has had no intercurrent episodes.  He is tolerating his amiodarone  and his Betapace well and he says you are following his amiodarone  surveillance laboratories.   MEDICATIONS:  1. Tapazole.  2. Aspirin.  3. Betapace 40 b.i.d.  4. Amiodarone 200 a day.   EXAMINATION:  His blood pressure today was 124/84, his pulse was 53.  LUNGS:  Clear.  HEART:  Sounds were regular.  EXTREMITIES:  Were without edema.   Interrogation of his Guidant Vitality ICD demonstrates an R wave of 7  with impendence 644 with threshold of 1.2 at 0.4 milliseconds, battery  voltage was 3.24, there were no intercurrent episodes.   IMPRESSION:  1. Arrhythmogenic right ventricular dysplasia.  2. Recurrent ventricular tachycardia.  3. Status post implantable cardioverter defibrillator for the above      with amiodarone and Betapace for ventricular tachycardia storm.  4. Treated hyperthyroidism.   Mr. Anthis is stable.  We will continue to monitor him remotely and we  will see him again in one year's time.    Sincerely,      Duke Salvia, MD, [facility]  Electronically Signed    SCK/MedQ  DD: 09/16/2006  DT: 09/16/2006  Job #: [id number]   CC:    Geoffry Paradise, M.D.

## 2010-07-14 NOTE — Discharge Summary (Signed)
[place]. [facility]  Patient:    Ernest Morgan, Ernest Morgan                       MRN: [id number] Adm. Date:  [id number] Disc. Date: 12/08/99 Attending:  Nathen May Dictator:   Rozell Searing, P.A. CC:         Richard A. Jacky Kindle, M.D.   Referring Physician Discharge [place]  PROCEDURES:  Guidant VENTAK ICD implantation December 07, 1999.  HOSPITAL COURSE:  Ernest Morgan is a 45 year old male patient of Dr. Odessa Fleming with history of arrhythmogenic RV dysplasia, amiodarone-induced hyperthyroidism, who was recently evaluated by Dr. Graciela Husbands for "VT storm" requiring up-titration of amiodarone.  The issue of proceeding with ICD implantation was raised at that time and patient agreed to proceed with this therapeutic option.  He was admitted for elective ICD implantation on October 11.  Patient underwent successful implantation of Guidant VENTAK ICD by Dr. Graciela Husbands on October 11, using a single-chamber model.  Postoperative evaluation revealed occasional ventricular pacing with appropriate sensing.  The device was interrogated prior to discharge, for which the patient was cleared on postoperative day #1.  Final recommendations by Dr. Graciela Husbands were to have patient resume previous home medication regimen until office follow-up in 10-14 days.  Patient will present at that time for a wound check and review of medications for possible adjustment by Dr. Odessa Fleming.  PREADMISSION LABORATORY DATA:  WBC 5.6, hemoglobin 15.5, hematocrit 45.3, platelet 229.  INR 1.1.  Sodium 137, potassium 4.8, glucose 102, BUN 15, creatinine 1.3.  MEDICATIONS AT DISCHARGE: 1. Atenolol 50 mg q.d. 2. Tapazole 5 mg q.d. 3. Amiodarone 300 mg q.d. 4. Ecotrin 325 mg q.d.  DISCHARGE DIAGNOSES: 1. Arrhythmogenic right ventricular dysplasia.    a. Status post implantable cardioverter-defibrilator implantation,       October 11.    b. Status post recent "ventricular tachycardia storm." 2. Hyperthyroidism -  secondary to amiodarone. DD:  12/08/99 TD:  12/08/99 Job: [id number]

## 2010-07-14 NOTE — Op Note (Signed)
[place]. [facility]  Patient:    Ernest Morgan, Ernest Morgan                       MRN: [id number] Adm. Date:  [id number] Attending:  Nathen May CC:         Electrophysiology Laboratory  [facility], Attn:  Delsa Grana  Richard A. Jacky Kindle, M.D.   Operative Report  PREOPERATIVE DIAGNOSIS:  Ventricular tachycardia; arrhythmogenic right ventricular dysplasia.  POSTOPERATIVE DIAGNOSIS:  Ventricular tachycardia, arrhythmogenic right ventricular dysplasia.  PROCEDURE:  Contrast venography and insertion of a single-chamber defibrillator with intraoperative defibrillation threshold testing.  DESCRIPTION OF PROCEDURE:  Following the obtaining of informed consent, the patient was brought to the electrophysiology laboratory and placed on the fluoroscopic table in the supine position.  After routine prep and drape of the left upper chest, intravenous contrast was injected via the left antecubital vein to identify the course and the patency of the extrathoracic left subclavian vein.  This having been accomplished, lidocaine was infiltrated in the prepectoral subclavicular region.  An incision was made and carried down to the layer of the prepectoral fascia using electrocautery.  The patient was under the care of [employer]. Zoila Shutter, M.D., for the duration of the case.  Thereafter, an incision was made and carried down through the prepectoral fashion using electrocautery.  A pocket was formed using electrocautery.  Hemostasis was obtained.  Thereafter, attention was turned to gaining access to the extrathoracic left subclavian vein, which was accomplished without difficulty and without the aspiration of air or puncture of the artery.  A guide wire was placed and retained.  A 0 silk suture was placed in figure-of-eight fashion and allowed to hang loosely.  Subsequently a 9 French tear-away introducer sheath was placed, though which was passed a Guidant,  model 0148, passive fixation, dual-coil defibrillator lead, serial [id number].  Under fluoroscopic guidance, this was manipulated to the right ventricular apex with a bipolar R wave of 8.3 mv with an impedance of 693 ohms and a pacing threshold at 0.5 msec and 0.5 volts.  The current threshold was 0.7 ma.  There was no diaphragmatic pacing with 10 volts.  With these acceptable parameters recorded, the lead was secured to the prepectoral fascia.  The lead was then attached to a Guidant 1861 prism 2 DR AICD in which the atrial port was plugged.  The serial number was [id number]. Through the device, the bipolar R wave was 6 mv with an impedance of 750 ohms and a pacing threshold of 0.5 msec at 0.8 volts.  Lead position appeared to be stable.  With these acceptable parameters recorded, defibrillation threshold testing was undertaken.  Ventricular fibrillation was induced via T-wave shock.  After a total duration of 10.5 seconds, a 17-joule shock was delivered through a measured resistance of 40 ohms, terminating ventricular fibrillation and restoring a paced rhythm that then gave rise to a sinus rhythm.  This was accomplished.  It had underlying leaf sensitivity.  Ventricular fibrillation was then reinduced.  Multiple attempts were taken to do the T-wave shock.  However, this was unsuccessful.  Finally a fibrillation high mode was utilized.  After a total duration of 10.5 seconds, a 17-joule shock was delivered through a measured resistance of 39 ohms, terminating ventricular fibrillation and restoring a paced rhythm that gave rise into sinus rhythm.  This was accomplished at nominal sensitivity.  With these acceptable parameters recorded, the system was implanted.  The pocket was  again copiously irrigated with antibiotic-containing saline solution.  The lead and the pulse generator were then placed in the pocket and secured to the prepectoral fashion.  The wound was closed in three layers in  a normal fashion.  The wound was washed and dried and a benzoin Steri-Strip dressing was then applied.  The sponge, needle, and instrument counts were correct at the end of the procedure, according to the staff. DD:  12/07/99 TD:  12/08/99 Job: [id number]

## 2010-07-14 NOTE — Assessment & Plan Note (Signed)
[facility]                                 ON-CALL NOTE   Morgan, Ernest                         MRN:          [id number]  DATE:11/23/2006                            DOB:          07-Sep-1965    PRIMARY CARDIOLOGIST:  Duke Salvia, MD, [facility].   I was contacted by Ernest Morgan wife who stated that his grandmother had  died and he was having a lot of issues with this.  She stated that she  had called his primary care physician to ask for something to relax him  and they stated that this would need to be provided by cardiology.   Ernest Morgan is not having any palpitations and his defibrillator has not  fired.  He is compliant with his medications including amiodarone and  Betapace.  I advised her that I would call in a short term prescription  for Xanax which I have done and that he was welcome to take either it or  Benadryl at bedtime for sleep.  I advised that Ernest Morgan should contact  us if he had any other issues or problems and follow up with his family  physician if the short term prescription for Xanax was not enough.      Theodore Demark, PA-C  Electronically Signed      Doylene Canning. Ladona Ridgel, MD  Electronically Signed   RB/MedQ  DD: 11/24/2006  DT: 11/24/2006  Job #: [id number]

## 2010-07-14 NOTE — Assessment & Plan Note (Signed)
[place] [facility]                         ELECTROPHYSIOLOGY OFFICE NOTE   Ernest, Morgan                         MRN:          [id number]  DATE:05/06/2006                            DOB:          Dec 03, 1965    Ernest Morgan returns today having recently been found to be at [facility] on his  ICD.  He is a very pleasant young man with a history of arrhythmogenic  right ventricular dysplasia and ventricular tachycardia status post ICD  insertion back in October of 2001.  He has had recurrent episodes of VT  but none recently while he has been maintained on amiodarone and  Betapace.  He denies chest pain or shortness of breath.   EXAM:  He is a pleasant, well-appearing young man in no acute distress.  Blood pressure was 113/80, the pulse was 54 and regular, respirations  were 18, weight was 135 pounds.  NECK:  No jugular venous distention.  LUNGS:  Clear bilaterally to auscultation.  CARDIAC:  Regular rate and rhythm with a normal S1 and S2.  EXTREMITIES:  No edema.   Interrogation of his defibrillator demonstrates a Guidant Prizm 2 with R  waves of 4.3, an impedance of 624 ohms, and a threshold of 1/2 volts at  0.4 milliseconds.  Battery voltage was 2.48 volts.  His ERI had been  reached approximately 1 week ago.   IMPRESSION:  1. Arrhythmogenic right ventricular dysplasia.  2. Ventricular tachycardia.  3. Status post implantable cardioverter-defibrillator insertion now      with device at elective replacement indicator.   DISCUSSION:  Ernest Morgan is stable, his defibrillator is working normally  but has reached ERI.  Will plan to have him come in for ICD generator  change in the next several days.     Doylene Canning. Ladona Ridgel, MD  Electronically Signed    GWT/MedQ  DD: 05/06/2006  DT: 05/06/2006  Job #: [id number]   cc:   Geoffry Paradise, M.D.

## 2010-07-14 NOTE — Assessment & Plan Note (Signed)
[place] [facility]                         ELECTROPHYSIOLOGY OFFICE NOTE   Ernest Morgan, Ernest Morgan                         MRN:          [id number]  DATE:06/03/2006                            DOB:          [birth year]    [name] was seen today in the clinic on June 03, 2006, for wound check  of his newly implanted Guidant model number T175 Vitality.  Date of  implant was May 15, 2006, for ventricular tachycardia.  On  interrogation of his device today, his battery voltage was 3.25 with a  charge time of 7.6 seconds.  R-waves measured 4.9 mV which is stable  from his previous device.  He has chronic leads and it was 4.3 mV back  on May 06, 2006, and a ventricular pacing threshold of 1 volt at 0.4  msec with a ventricular lead impedance of 587 ohms.  Shock impedance was  48.  There was one induced episode.  No other episodes since implant  date.  His Steri-Strips had already been removed.  His wound is without  redness or edema.  No changes were made in his parameters and he will be  seen again in three months' time.      Altha Harm, LPN  Electronically Signed      Duke Salvia, MD, [facility]  Electronically Signed   PO/MedQ  DD: 06/03/2006  DT: 06/03/2006  Job #: [id number]

## 2010-07-14 NOTE — Op Note (Signed)
NAME:  Ernest Morgan, Ernest Morgan NO.:  [id number]   MEDICAL RECORD NO.:  [id number]          PATIENT TYPE:  INP   LOCATION:  2899                         FACILITY:  [facility]   PHYSICIAN:  Duke Salvia, MD, FACCDATE OF BIRTH:  06-25-1965   DATE OF PROCEDURE:  05/15/2006  DATE OF DISCHARGE:                               OPERATIVE REPORT   PREOPERATIVE DIAGNOSES:  Arrhythmogenic right ventricular dysplasia,  status post prior implantable cardioverter-defibrillator implantation,  now at end of life.   POSTOPERATIVE DIAGNOSES:  Arrhythmogenic right ventricular dysplasia,  status post prior implantable cardioverter-defibrillator implantation,  now at end of life.   PROCEDURE:  Explantation of a previous implanted defibrillator and  implantation of a new defibrillator, a pocket revision, intraoperative  defibrillation threshold testing.   After obtaining informed consent, the patient was brought to the  electrophysiologic laboratory and placed on the fluoroscopic table in  the supine position.  After routine prep and drape of the left upper  chest, lidocaine was infiltrated over the line of the previous incision  and carried down to the layer of the device pocket using sharp  dissection and electrocautery.  The device was freed up and explanted.  The pocket was then revised.  The lead was freed up from the floor of  the pocket, the scar tissue was removed, and the pocket was then  extended caudally to allow for insertion of the larger device.  This  having been done, the previously implanted lead was assessed with an  Endotak 0148, serial number [id number].  Through the DPSA the R-wave was 6.6  with the pacing impedance of 710, and a threshold of 0.1 volt at 0.5.   The lead was then attached to a Guidant Vitality 2 ICD, model number  T1750, serial number [id number].  Through the device bipolar R-wave was 5.2  with a pacing impedance of 559 and a threshold of 0.1 volt at 0.4  milliseconds.  High voltage impedance was 55 ohm.   Thereafter defibrillation threshold testing was undertaken.  Ventricular  atrial fibrillation was induced via T-wave shock.  In fact, ventricular  tachycardia ensued with a cycle of about 380 milliseconds.  This was  paced from 80-300 milliseconds to restore sinus rhythm.   Ventricular atrial fibrillation was introduced using a T-wave shock.  After a controlled duration of 6 seconds, a 17-joule shock was delivered  to measure a resistance of 38 ohms to terminate ventricular atrial  fibrillation restored to sinus rhythm.  At this point the device was  implanted.  The pocket was copiously irrigated with antibiotic  containing solution.  Hemostasis was assured and the lead and pulse  generator was placed in the pocket.  The wound was closed  in 3 layers in a normal fashion.  The wound was washed, dried and a  benzoin and Steri-Strip dressing was applied.  Needle counts, sponge  counts and instrument counts were correct at the end of the procedure  according to the staff.  The patient tolerated the procedure without  apparent complications.      Duke Salvia, MD, [facility]  Electronically Signed     SCK/MEDQ  D:  05/15/2006  T:  05/16/2006  Job:  [id number]   cc:   [facility]  Electrophysiology Laboratory  Dr. Shon Hough

## 2010-07-14 NOTE — Discharge Summary (Signed)
NAMEHARSHA, Ernest Morgan                ACCOUNT NO.:  [id number]   MEDICAL RECORD NO.:  [id number]          PATIENT TYPE:  INP   LOCATION:  2899                         FACILITY:  [facility]   PHYSICIAN:  Duke Salvia, MD, FACCDATE OF BIRTH:  1965-10-04   DATE OF ADMISSION:  05/15/2006  DATE OF DISCHARGE:  05/15/2006                               DISCHARGE SUMMARY   PRINCIPAL DIAGNOSES:  1. Cardioverter-defibrillator at elective replacement indicator.  2. Implant of Guidant VITALITY 2 ICD as generator change May 15, 2006, Dr. Sherryl Manges.   SECONDARY DIAGNOSES:  1. Arrhythmogenic right ventricular dysplasia and ventricular      tachycardia.  2. Status post cardioverter-defibrillator implantation October of      2001.  3. History of recurrent episodes of ventricular tachycardia.  4. Amiodarone has prevented recurrence of ventricular tachycardia.   PROCEDURE:  May 15, 2006 - generator change out with implantation of  Guidant VITALITY 2 ICD, Dr. Sherryl Manges.  No complications post  procedurally.   BRIEF HISTORY:  Mr. Kustra is a 46 year old male who has been followed for  some years by Dr. Graciela Husbands.  He has a history of arrhythmogenic right  ventricular dysplasia.  He had ventricular tachycardia in the past.  This occasioned implantation of cardioverter defibrillator October 2001.  He has been on amiodarone since that time and has had no recurrent  episodes of VT.  He is also on Betapace.  The patient has overall been  stable.  His incision looks, and he is ready for discharge on the  morning after the procedure.  On May 16, 2006, he will remove the  bandage and keep the incision open to air.  He is to keep the incision  dry for the next 7 days, and to sponge bathe until Wednesday, May 22, 2006.  He follows up at [facility], [address]  for an incision check on Monday, June 03, 2006 at 9:40.   MEDICATIONS:  1. Tapazole 5 mg daily.  2. Amiodarone 200  mg daily.  3. Betapace to continue as before this admission.  4. Enteric-coated aspirin 325 mg daily.   The patient also carries a secondary diagnosis of hyperthyroidism  secondary to amiodarone therapy.   LABORATORY DATA:  Laboratory studies pertinent to this admission were  taken May 06, 2006.  White cells 6.7, hemoglobin 15.6, hematocrit  44.7, platelets 257.  Pro time is 12.3, INR 1.0.  Serum electrolytes -  sodium 140, potassium 4.2, chloride 103, carbonate 29, glucose is 98,  BUN is 12, creatinine 1.2.   Greater than 25 minutes on this discharge.      Maple Mirza, [place]      Duke Salvia, MD, [facility]  Electronically Signed    GM/MEDQ  D:  05/15/2006  T:  05/15/2006  Job:  [id number]   cc:   Geoffry Paradise, M.D.

## 2010-07-14 NOTE — Discharge Summary (Signed)
[facility]  Patient:    Ernest Morgan, Ernest Morgan                       MRN: [id number] Adm. Date:  [id number] Attending:  Nathen May Dictator:   Tereso Newcomer, P.A.                           Discharge Summary  DATE OF BIRTH:  05/05/1965  DISCHARGE DIAGNOSES: 1. Chest pain with negative adenosine Cardiolite. 2. History of recurrent ventricular tachycardia. 3. Ventricular tachycardia during adenosine Cardiolite. 4. History of hypothyroidism.  ADMISSION HISTORY:  This 45 year old male with no known coronary artery disease and a history of recurrent ventricular tachycardia, presented to the emergency room complaining of acute onset left-sided chest pain.  He described it as aching, and it went to his neck and his stomach, and it was a 5/10.  It occurred while driving and lasted about 14-78 minutes.  He denied any nausea, vomiting, or diaphoresis, and no similar symptoms previously.  FAMILY HISTORY:  Significant for coronary artery disease.  SOCIAL HISTORY:  The patient denied any tobacco or alcohol use.  ADMISSION PHYSICAL EXAMINATION:  GENERAL:  A patient in no acute distress.  VITAL SIGNS:  Blood pressure 109/70 and pulse 55.  HEART:  Regular rate and rhythm.  LUNGS:  Clear to auscultation.  NEUROLOGIC:  Nonfocal.  ADMISSION LABORATORY DATA:  Sodium 143, potassium 3.9, chloride 106, CO2 26, BUN 11, creatinine 1.1, and glucose 90.  Hemoglobin 14 and hematocrit 42. Chest x-ray showed no acute disease.  HOSPITAL COURSE:  The patient was admitted and had serial enzymes performed to rule-out myocardial infarction.  Troponin I x 3 was negative.  Total CK #1 was 76 and CK-MB 0.5.  WBC 5.7, hemoglobin 14.0, hematocrit 42.0, and platelet count 177.  Sodium 138, potassium 4.2, chloride 106, CO2 26, glucose 89, BUN 11, and creatinine 1.1.  Total bilirubin 0.9, alkaline phosphatase 72, SGOT 24, SGPT 13, total protein 5.9, albumin 4.1, and  calcium 9.5.  The patient remained in stable condition.  He went for his adenosine Cardiolite on May 25, 1999.  It was noted that he had a short run of ventricular tachycardia prior to the infusion.  Dr. Ladona Ridgel was called and assisted, and the Cardiolite proceeded.  The patient had multiple runs of ventricular tachycardia throughout the infusion, which was associated with chest pressure.  After infusion, the patient returned to normal sinus rhythm, and his chest pain alleviated.  His Cardiolite images were without ischemia.  Dr. Ladona Ridgel felt that the patients ectopy should be observed overnight, and he should continue on his amiodarone.  The patient, throughout his course, had multiple runs of ventricular tachycardia.  It was felt that the patient was stable enough for discharge on May 26, 1999.  DISCHARGE MEDICATIONS: 1. Amiodarone 600 mg q.d. for two weeks, then return to 200 mg q.d. 2. Atenolol 50 mg q.d. 3. Tapazole 5 mg q.d. 4. Enteric-coated aspirin 325 mg q.d.  FOLLOWUP:  Our office is to call the patient with a followup appointment.  At that time, cardiac MRI will be scheduled as an outpatient. DD:  05/26/99 TD:  05/26/99 Job: [id number]

## 2010-08-10 ENCOUNTER — Encounter: Payer: BLUE CROSS/BLUE SHIELD | Admitting: *Deleted

## 2010-08-15 ENCOUNTER — Encounter: Payer: Self-pay | Admitting: *Deleted

## 2010-09-28 ENCOUNTER — Telehealth: Payer: Self-pay | Admitting: Internal Medicine

## 2010-09-28 NOTE — Telephone Encounter (Signed)
This was routed to me by mistake will forward to [facility]. To address w/Dr Graciela Husbands tom

## 2010-09-28 NOTE — Telephone Encounter (Signed)
Pt's wife calling re grandson being referred to a dr from [facility] that was friends with dr Graciela Husbands, and forgot his name and they haven't gotten a recall from them and he is now due

## 2010-09-29 NOTE — Telephone Encounter (Signed)
Per Dr. Graciela Husbands, he spoke with the family. They had remembered the name Rosiland Oz, Md in [place]. No need to do anything further per Dr. Graciela Husbands.

## 2010-09-29 NOTE — Telephone Encounter (Signed)
Encounter closed in error. Forwarding to Dr. Graciela Husbands.

## 2010-09-29 NOTE — Telephone Encounter (Signed)
I am not aware of this situation- will forward to Dr. Graciela Husbands for review.

## 2010-10-09 ENCOUNTER — Other Ambulatory Visit: Payer: Self-pay | Admitting: Internal Medicine

## 2010-10-23 ENCOUNTER — Telehealth: Payer: Self-pay | Admitting: Internal Medicine

## 2010-10-23 NOTE — Telephone Encounter (Signed)
PT calling stating he was experiencing leg pain since starting generic betapace and generic amiordarone --looking in med book see no referrance to these symptoms--advised pt i would let dr Graciela Husbands and [facility] know about complaint and give pt a call--i advised to continue with these meds until he hears from us--pt agrees--nt

## 2010-10-23 NOTE — Telephone Encounter (Signed)
Pt calling re refill was called  In as generic, wants to make sure it's ok for him to take generic

## 2010-10-24 NOTE — Telephone Encounter (Signed)
Would resume proprieitary amiodarone and leave him on genereic sotalol and see if this makes a difference thnaks steve

## 2011-01-30 ENCOUNTER — Other Ambulatory Visit: Payer: Self-pay | Admitting: Internal Medicine

## 2011-03-20 ENCOUNTER — Telehealth: Payer: Self-pay | Admitting: Internal Medicine

## 2011-03-20 NOTE — Telephone Encounter (Signed)
New msg: pt calling stating that his L hand and L index finger are numb and starting to turn colors. Pt wants to know if this should be of concern. Pt not c/o any other symptoms. Please return pt call to discuss further.

## 2011-03-20 NOTE — Telephone Encounter (Signed)
I spoke with the patient. He states that this morning he started to notice some numbness to his left hand and now some discoloration to his left index finger. 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 He does not recall hitting his hand and denies edema. He does have a device implanted on the left side. I have reviewed this with Dr. Graciela Husbands and he does not feel like this is related to the device, but he is concerned about the patient's symptoms. He does feel as though the patient should have this looked at where there is neuro available if needed. He recommends the patient go to the ER for evaluation. I have made the patient aware of this and he is agreeable.

## 2011-03-30 ENCOUNTER — Encounter: Payer: Self-pay | Admitting: Internal Medicine

## 2011-03-30 ENCOUNTER — Ambulatory Visit (INDEPENDENT_AMBULATORY_CARE_PROVIDER_SITE_OTHER): Payer: BLUE CROSS/BLUE SHIELD | Admitting: *Deleted

## 2011-03-30 DIAGNOSIS — I472 Ventricular tachycardia: Secondary | ICD-10-CM

## 2011-03-30 LAB — ICD DEVICE OBSERVATION
BATTERY VOLTAGE: 2.61 V
BRDY-0002RV: 40 {beats}/min
CHARGE TIME: 11.5 s
DEV-0020ICD: NEGATIVE
DEVICE MODEL ICD: 128083
HV IMPEDENCE: 48 Ohm
RV LEAD AMPLITUDE: 4.8 mv
RV LEAD IMPEDENCE ICD: 703 Ohm
RV LEAD THRESHOLD: 1.2 V
TZAT-0001FASTVT: 1
TZAT-0001FASTVT: 2
TZAT-0001SLOWVT: 1
TZAT-0001SLOWVT: 2
TZAT-0004FASTVT: 4
TZAT-0004FASTVT: 4
TZAT-0004SLOWVT: 8
TZAT-0004SLOWVT: 8
TZAT-0005FASTVT: 81 pct
TZAT-0005FASTVT: 81 pct
TZAT-0005SLOWVT: 81 pct
TZAT-0005SLOWVT: 81 pct
TZAT-0012FASTVT: 200 ms
TZAT-0012FASTVT: 200 ms
TZAT-0012SLOWVT: 200 ms
TZAT-0012SLOWVT: 200 ms
TZAT-0013FASTVT: 2
TZAT-0013FASTVT: 2
TZAT-0013SLOWVT: 2
TZAT-0013SLOWVT: 2
TZAT-0018FASTVT: NEGATIVE
TZAT-0018FASTVT: NEGATIVE
TZAT-0018SLOWVT: NEGATIVE
TZAT-0018SLOWVT: NEGATIVE
TZAT-0019FASTVT: 7.5 V
TZAT-0019FASTVT: 7.5 V
TZAT-0019SLOWVT: 7.5 V
TZAT-0019SLOWVT: 7.5 V
TZAT-0020FASTVT: 1 ms
TZAT-0020FASTVT: 1 ms
TZAT-0020SLOWVT: 1 ms
TZAT-0020SLOWVT: 1 ms
TZON-0003FASTVT: 300 ms
TZON-0003SLOWVT: 375 ms
TZON-0004FASTVT: 2.5
TZON-0004SLOWVT: 2.5
TZON-0005FASTVT: 1
TZON-0005SLOWVT: 1
TZST-0001FASTVT: 3
TZST-0001FASTVT: 4
TZST-0001FASTVT: 5
TZST-0001FASTVT: 6
TZST-0001FASTVT: 7
TZST-0001SLOWVT: 3
TZST-0001SLOWVT: 4
TZST-0001SLOWVT: 5
TZST-0001SLOWVT: 6
TZST-0001SLOWVT: 7
TZST-0003FASTVT: 26 J
TZST-0003FASTVT: 31 J
TZST-0003FASTVT: 31 J
TZST-0003FASTVT: 31 J
TZST-0003FASTVT: 31 J
TZST-0003SLOWVT: 26 J
TZST-0003SLOWVT: 31 J
TZST-0003SLOWVT: 31 J
TZST-0003SLOWVT: 31 J
TZST-0003SLOWVT: 31 J
VENTRICULAR PACING ICD: 1 pct

## 2011-03-30 NOTE — Progress Notes (Signed)
Device check in clinic  

## 2011-04-30 ENCOUNTER — Telehealth: Payer: Self-pay | Admitting: Internal Medicine

## 2011-04-30 NOTE — Telephone Encounter (Signed)
Pt to call pcp per Dr Patty Sermons as it may be related to the GI problems.  Pt was notified and agrees.

## 2011-04-30 NOTE — Telephone Encounter (Signed)
Patient would like  Return call [phone]  Has been sick with the flu over the last several days , felt some chest discomfort this morning No SOB or Dizziness. He can be reached on mobile for return call [phone]

## 2011-04-30 NOTE — Telephone Encounter (Signed)
Ernest Morgan is reporting nausea and vomiting since Wed pm.  The N&V stopped late Friday pm but since abdomen has been "rock hard" since that time.  No diarrhea but he did have sweating and chills Thurs and Fri.  This morning he developed chest pain on Saturday which lasted about 10 minutes.  He also woke up with chest pain this am. around 7:00 and still has it.  It is dull and in the upper middle and upper right side of his chest (5/10).  The pain is worse when he moves around.  No sob.  He states his pcp called him an rx of cough syrup (oxycodone, tamiflu and azithromycin) on Friday.

## 2011-04-30 NOTE — Telephone Encounter (Signed)
Correction, regarding the chest pain, pt had chest pain on Saturday and Sunday lasting about 10 minutes.  He woke up with the chest pain this am around 7:00.  Pain is a 5/10 and has not disspated yet at 9:30.  Pain is in upper middle and upper right side of his chest.

## 2011-05-13 IMAGING — CR DG ABDOMEN 2V
2 series · 2 of 2 positions shown · non-contrast
Comparison: 08/05/2009

CLINICAL DATA: Nausea vomiting.

ABDOMEN - 2 VIEW

[w abdomen upright]
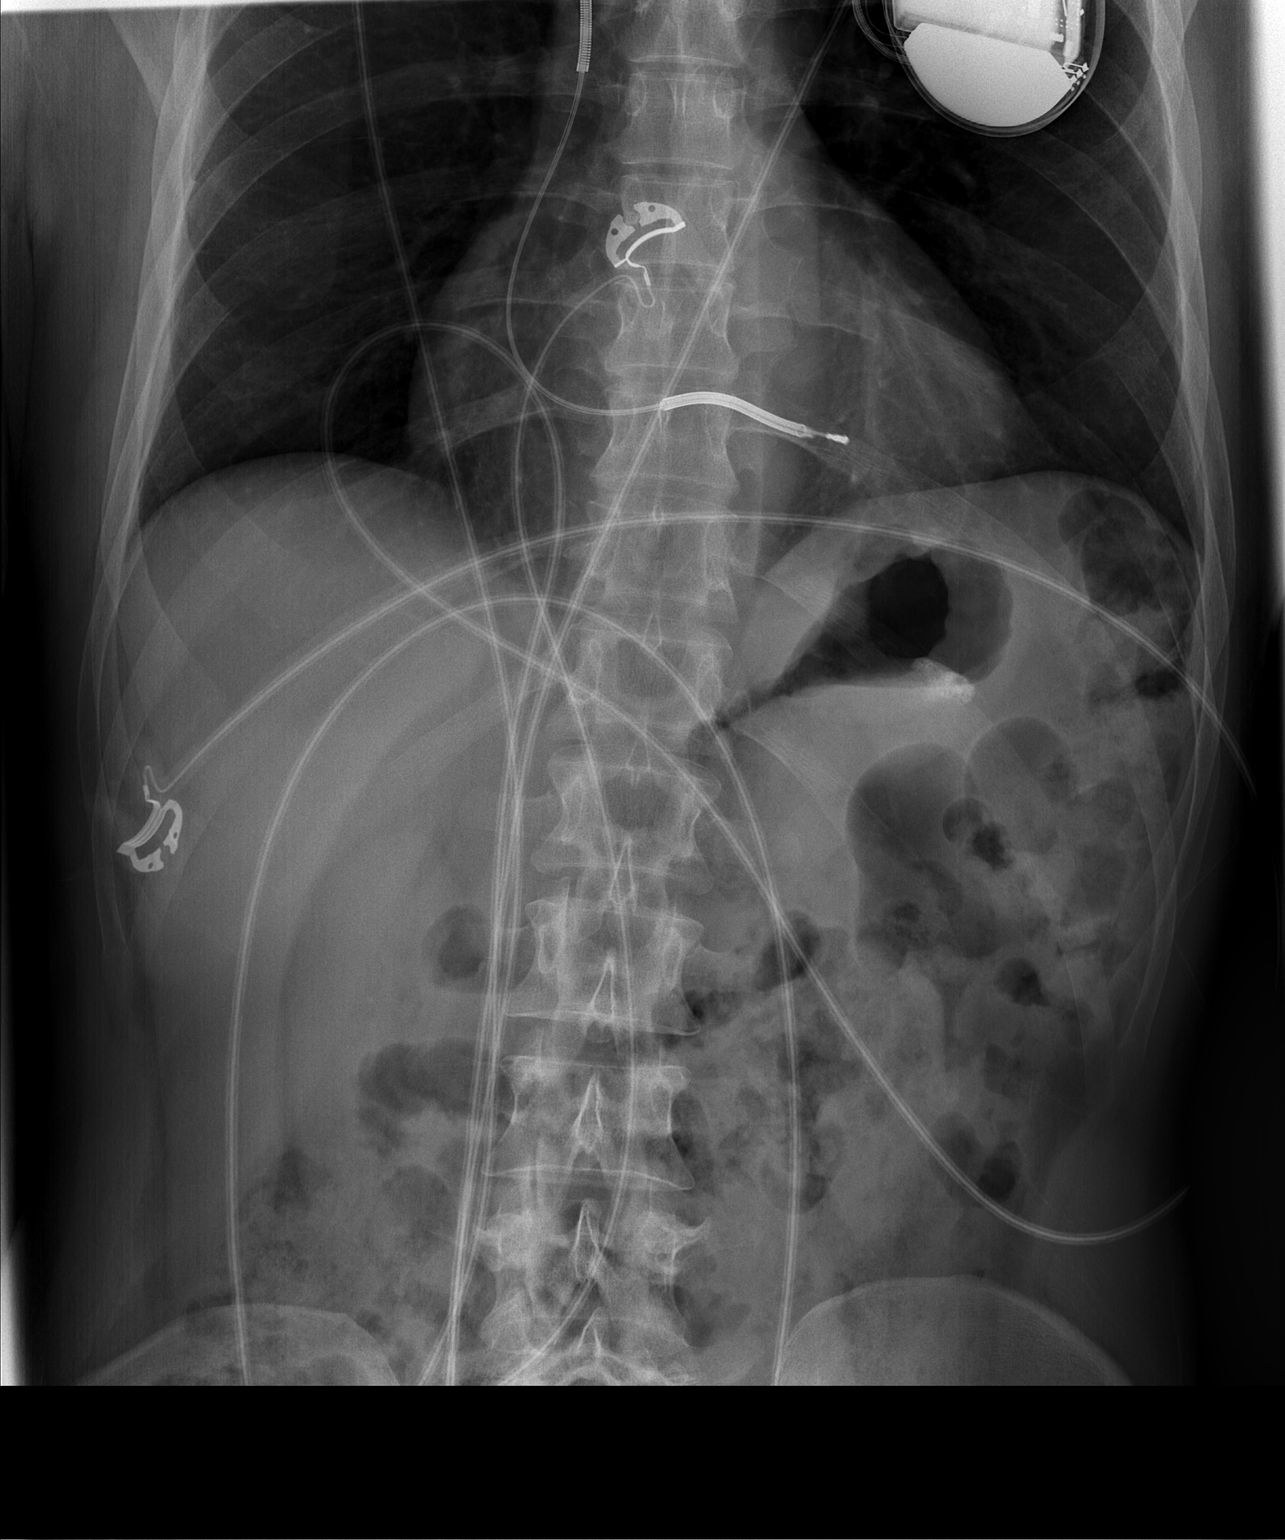

[t abdomen supine]
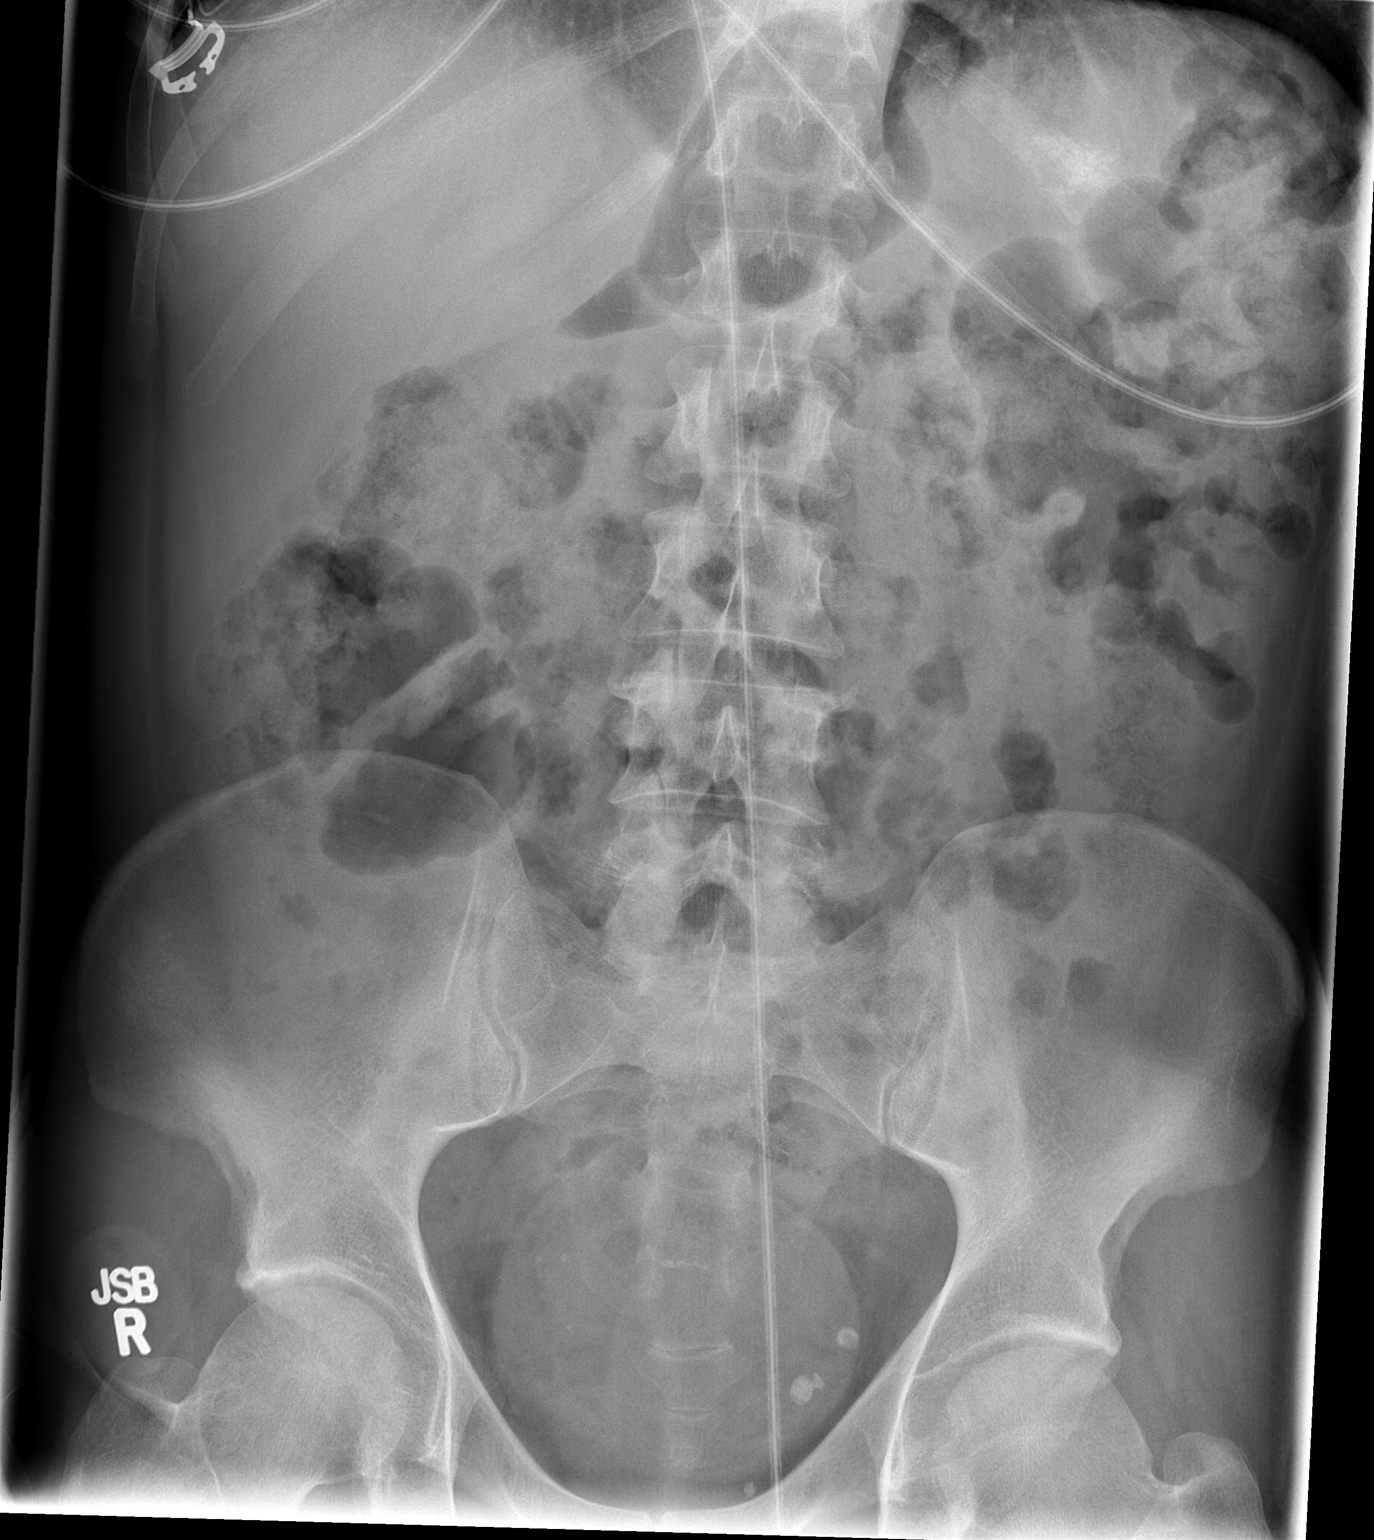

[2 of 2 positions shown; findings below may reference images not displayed]

FINDINGS: Upright abdomen is without evidence of intraperitoneal
free air.  Supine film shows gas scattered through nondilated small
bowel loops.  The gaseous distention of the left colon seen on
yesterday's film has resolved in the interval.
IMPRESSION: No intraperitoneal free air or evidence for bowel obstruction.

## 2011-06-16 ENCOUNTER — Other Ambulatory Visit: Payer: Self-pay | Admitting: Internal Medicine

## 2011-07-19 ENCOUNTER — Encounter: Payer: BLUE CROSS/BLUE SHIELD | Admitting: Internal Medicine

## 2011-08-29 ENCOUNTER — Encounter: Payer: Self-pay | Admitting: *Deleted

## 2011-08-29 DIAGNOSIS — Z95810 Presence of automatic (implantable) cardiac defibrillator: Secondary | ICD-10-CM | POA: Insufficient documentation

## 2011-09-04 ENCOUNTER — Encounter: Payer: BLUE CROSS/BLUE SHIELD | Admitting: Internal Medicine

## 2011-10-03 ENCOUNTER — Other Ambulatory Visit: Payer: Self-pay | Admitting: Internal Medicine

## 2011-10-26 ENCOUNTER — Encounter: Payer: Self-pay | Admitting: Internal Medicine

## 2011-10-30 ENCOUNTER — Encounter: Payer: BLUE CROSS/BLUE SHIELD | Admitting: Internal Medicine

## 2011-11-15 ENCOUNTER — Other Ambulatory Visit: Payer: Self-pay | Admitting: Internal Medicine

## 2011-12-20 ENCOUNTER — Other Ambulatory Visit: Payer: Self-pay | Admitting: Internal Medicine

## 2012-01-14 ENCOUNTER — Telehealth: Payer: Self-pay | Admitting: Internal Medicine

## 2012-01-14 NOTE — Telephone Encounter (Signed)
New Problem:    Patient called in because he had seen something on TV about the possibility of the guidewires in the Medtronic brand of ICD being at an increased risk for breaking off.  Patient wanted wanting to know if the guide wires in his pacemaker were at risk for breaking off.  Please call back.

## 2012-01-14 NOTE — Telephone Encounter (Signed)
Reassured pt

## 2012-01-22 ENCOUNTER — Encounter: Payer: BLUE CROSS/BLUE SHIELD | Admitting: Internal Medicine

## 2012-01-28 ENCOUNTER — Encounter: Payer: Self-pay | Admitting: Internal Medicine

## 2012-01-28 ENCOUNTER — Ambulatory Visit (INDEPENDENT_AMBULATORY_CARE_PROVIDER_SITE_OTHER): Payer: 59 | Admitting: Internal Medicine

## 2012-01-28 VITALS — BP 114/79 | HR 57 | Ht 69.0 in | Wt 126.0 lb

## 2012-01-28 DIAGNOSIS — I428 Other cardiomyopathies: Secondary | ICD-10-CM | POA: Insufficient documentation

## 2012-01-28 DIAGNOSIS — I472 Ventricular tachycardia: Secondary | ICD-10-CM

## 2012-01-28 DIAGNOSIS — Z95810 Presence of automatic (implantable) cardiac defibrillator: Secondary | ICD-10-CM

## 2012-01-28 LAB — ICD DEVICE OBSERVATION
BATTERY VOLTAGE: 2.59 V
BRDY-0002RV: 40 {beats}/min
DEV-0020ICD: NEGATIVE
DEVICE MODEL ICD: 128083
HV IMPEDENCE: 46 Ohm
RV LEAD AMPLITUDE: 3.6 mv
RV LEAD IMPEDENCE ICD: 703 Ohm
RV LEAD THRESHOLD: 1.2 V
TZAT-0001FASTVT: 1
TZAT-0001FASTVT: 2
TZAT-0001SLOWVT: 1
TZAT-0001SLOWVT: 2
TZAT-0004FASTVT: 4
TZAT-0004FASTVT: 4
TZAT-0004SLOWVT: 8
TZAT-0004SLOWVT: 8
TZAT-0005FASTVT: 81 pct
TZAT-0005FASTVT: 81 pct
TZAT-0005SLOWVT: 81 pct
TZAT-0005SLOWVT: 81 pct
TZAT-0012FASTVT: 200 ms
TZAT-0012FASTVT: 200 ms
TZAT-0012SLOWVT: 200 ms
TZAT-0012SLOWVT: 200 ms
TZAT-0013FASTVT: 2
TZAT-0013FASTVT: 2
TZAT-0013SLOWVT: 2
TZAT-0013SLOWVT: 2
TZAT-0018FASTVT: NEGATIVE
TZAT-0018FASTVT: NEGATIVE
TZAT-0018SLOWVT: NEGATIVE
TZAT-0018SLOWVT: NEGATIVE
TZAT-0019FASTVT: 7.5 V
TZAT-0019FASTVT: 7.5 V
TZAT-0019SLOWVT: 7.5 V
TZAT-0019SLOWVT: 7.5 V
TZAT-0020FASTVT: 1 ms
TZAT-0020FASTVT: 1 ms
TZAT-0020SLOWVT: 1 ms
TZAT-0020SLOWVT: 1 ms
TZON-0003FASTVT: 300 ms
TZON-0003SLOWVT: 375 ms
TZON-0004FASTVT: 2.5
TZON-0004SLOWVT: 2.5
TZON-0005FASTVT: 1
TZON-0005SLOWVT: 1
TZST-0001FASTVT: 3
TZST-0001FASTVT: 4
TZST-0001FASTVT: 5
TZST-0001FASTVT: 6
TZST-0001FASTVT: 7
TZST-0001SLOWVT: 3
TZST-0001SLOWVT: 4
TZST-0001SLOWVT: 5
TZST-0001SLOWVT: 6
TZST-0001SLOWVT: 7
TZST-0003FASTVT: 26 J
TZST-0003FASTVT: 31 J
TZST-0003FASTVT: 31 J
TZST-0003FASTVT: 31 J
TZST-0003FASTVT: 31 J
TZST-0003SLOWVT: 26 J
TZST-0003SLOWVT: 31 J
TZST-0003SLOWVT: 31 J
TZST-0003SLOWVT: 31 J
TZST-0003SLOWVT: 31 J
VENTRICULAR PACING ICD: 1 pct

## 2012-01-28 NOTE — Assessment & Plan Note (Signed)
The patient has arrhythmogenic RV cardiomyopathy. We have discussed genetic testing. He would like to pursue that. He has 2 daughters. We will get the genetic counseling from the testing company.

## 2012-01-28 NOTE — Assessment & Plan Note (Signed)
No intercurrent VT. On amiodarone and sotalol. We'll need surveillance laboratories

## 2012-01-28 NOTE — Progress Notes (Signed)
Patient Care Team: Minda Meo, MD as PCP - General (Internal Medicine)   HPI  Ernest Morgan is a 46 y.o. male Seen in followup for arrhythmogenic RV cardiomyopathy. He has been poorly compliant. He takes sotalol and amiodarone Last surveillance laboratories were June 2011  He is now separated from his wife  The patient denies chest pain, shortness of breath, nocturnal dyspnea, orthopnea or peripheral edema.  There have been no palpitations, lightheadedness or syncope.   He has two daughers   Past Medical History  Diagnosis Date  . Arrhythmia     arrythmogenic RV dysplasia  . Ventricular tachycardia   . Arrhythmogenic right ventricular cardiomyopathy 01/28/2012  . ICD -[employer] 08/29/2011    Past Surgical History  Procedure Date  . Icd     Guidant- T175 Vitality    Current Outpatient Prescriptions  Medication Sig Dispense Refill  . amiodarone (PACERONE) 200 MG tablet TAKE ONE TABLET BY MOUTH ONE TIME DAILY  30 tablet  1  . aspirin 81 MG tablet Take 81 mg by mouth daily.      . nitroGLYCERIN (NITROSTAT) 0.4 MG SL tablet Place 0.4 mg under the tongue every 5 (five) minutes as needed.      . sotalol (BETAPACE) 80 MG tablet TAKE 1/2 TABLET BY MOUTH TWICE DAILY  30 tablet  0    Allergies  Allergen Reactions  . Lidocaine     REACTION: Slow Heartrate    Review of Systems negative except from HPI and PMH  Physical Exam BP 114/79  Pulse 57  Ht 5\' 9"  (1.753 m)  Wt 126 lb (57.153 kg)  BMI 18.61 kg/m2 Well developed and well nourished in no acute distress HENT normal E scleral and icterus clear Neck Supple  Clear to ausculation  Regular rate and rhythm, no murmurs gallops or rub Soft with active bowel sounds No clubbing cyanosis none Edema Alert and oriented, grossly normal motor and sensory function Skin Warm and Dry  Electrocardiogram demonstrates sinus rhythm at 57 Intervals 16/09/45 T wave inversions 3 F V1-V5 Occasional PVC  Assessment and   Plan

## 2012-01-28 NOTE — Assessment & Plan Note (Signed)
,  dfn The patient's device was interrogated.  The information was reviewed. No changes were made in the programming.   He is approaching ERI

## 2012-01-28 NOTE — Patient Instructions (Addendum)
Your physician recommends that you schedule a follow-up appointment in: 3 months with K&P in the device clinic.  LABS TODAY:  CMET, TSH, MAG

## 2012-01-29 LAB — MAGNESIUM: Magnesium: 2.1 mg/dL (ref 1.5–2.5)

## 2012-01-29 LAB — COMPREHENSIVE METABOLIC PANEL
ALT: 15 U/L (ref 0–53)
AST: 17 U/L (ref 0–37)
Albumin: 4.9 g/dL (ref 3.5–5.2)
Alkaline Phosphatase: 85 U/L (ref 39–117)
BUN: 12 mg/dL (ref 6–23)
CO2: 27 mEq/L (ref 19–32)
Calcium: 9.2 mg/dL (ref 8.4–10.5)
Chloride: 101 mEq/L (ref 96–112)
Creatinine, Ser: 1 mg/dL (ref 0.4–1.5)
GFR: 82.55 mL/min (ref >60.00)
Glucose, Bld: 113 mg/dL — ABNORMAL HIGH (ref 70–99)
Potassium: 4.6 mEq/L (ref 3.5–5.1)
Sodium: 134 mEq/L — ABNORMAL LOW (ref 135–145)
Total Bilirubin: 0.8 mg/dL (ref 0.3–1.2)
Total Protein: 7.1 g/dL (ref 6.0–8.3)

## 2012-01-29 LAB — TSH: TSH: 1.19 u[IU]/mL (ref 0.35–5.50)

## 2012-02-11 ENCOUNTER — Encounter: Payer: Self-pay | Admitting: Internal Medicine

## 2012-02-21 ENCOUNTER — Other Ambulatory Visit: Payer: Self-pay | Admitting: Internal Medicine

## 2012-02-26 ENCOUNTER — Other Ambulatory Visit: Payer: Self-pay | Admitting: Cardiology

## 2012-02-26 MED ORDER — SOTALOL HCL 80 MG PO TABS: ORAL_TABLET | ORAL | Status: DC

## 2012-02-28 ENCOUNTER — Other Ambulatory Visit: Payer: Self-pay | Admitting: Internal Medicine

## 2012-12-16 ENCOUNTER — Telehealth: Payer: Self-pay | Admitting: Cardiovascular Disease

## 2012-12-16 NOTE — Telephone Encounter (Signed)
New Patient would like to know if you would write him a statement so he can file for disability. Please call patient.

## 2012-12-17 NOTE — Telephone Encounter (Signed)
Referred patient to his PCP on this matter per Dr. Graciela Husbands. Patient verbalized understanding.

## 2012-12-25 ENCOUNTER — Encounter: Payer: Self-pay | Admitting: Internal Medicine

## 2013-01-24 ENCOUNTER — Other Ambulatory Visit: Payer: Self-pay | Admitting: Internal Medicine

## 2013-03-03 ENCOUNTER — Encounter: Payer: Self-pay | Admitting: Internal Medicine

## 2013-03-03 ENCOUNTER — Ambulatory Visit (INDEPENDENT_AMBULATORY_CARE_PROVIDER_SITE_OTHER): Payer: 59 | Admitting: Internal Medicine

## 2013-03-03 VITALS — BP 112/88 | HR 71 | Ht 69.0 in | Wt 141.1 lb

## 2013-03-03 DIAGNOSIS — Z95810 Presence of automatic (implantable) cardiac defibrillator: Secondary | ICD-10-CM

## 2013-03-03 DIAGNOSIS — I4729 Other ventricular tachycardia: Secondary | ICD-10-CM

## 2013-03-03 DIAGNOSIS — I472 Ventricular tachycardia: Secondary | ICD-10-CM

## 2013-03-03 DIAGNOSIS — I428 Other cardiomyopathies: Secondary | ICD-10-CM

## 2013-03-03 LAB — MDC_IDC_ENUM_SESS_TYPE_INCLINIC
Implantable Pulse Generator Serial Number: 128083
Lead Channel Setting Pacing Amplitude: 2.6 V
Lead Channel Setting Pacing Pulse Width: 0.4 ms
Zone Setting Detection Interval: 250 ms
Zone Setting Detection Interval: 300 ms
Zone Setting Detection Interval: 375 ms

## 2013-03-03 NOTE — Patient Instructions (Signed)
Your physician recommends that you continue on your current medications as directed. Please refer to the Current Medication list given to you today.  Your physician recommends that you schedule a follow-up appointment in: 3 months with device clinic.  Your physician wants you to follow-up in: 1 year with Dr. Klein.  You will receive a reminder letter in the mail two months in advance. If you don't receive a letter, please call our office to schedule the follow-up appointment.   

## 2013-03-03 NOTE — Assessment & Plan Note (Signed)
There've been no intercurrent ventricular tachycardia. He remains on amiodarone and sotalol. Surveillance blood work was done by Dr. Jacky Kindle month ago and was normal

## 2013-03-03 NOTE — Assessment & Plan Note (Signed)
He he has arrhythmogenic RV dysplasia. He has not yet fully decide about genetic testing and implications with his daughters. He is an only child. His parents are surviving and he also has siblings.

## 2013-03-03 NOTE — Assessment & Plan Note (Addendum)
The patient's device was interrogated.  The information was reviewed. No changes were made in the programming.    He is approaching ERI

## 2013-03-03 NOTE — Progress Notes (Signed)
      Patient Care Team: Minda Meo, MD as PCP - General (Internal Medicine)   HPI  Ernest Morgan is a 48 y.o. male  Seen in followup for ventricular tachycardia in the setting of her the RV dysplasia. He is doing combination of amiodarone and sotalol for some time now. Genetic testing was discussed at his last visit but was not consummated.   Past Medical History  Diagnosis Date  . Ventricular tachycardia   . Arrhythmogenic right ventricular cardiomyopathy 01/28/2012  . ICD -[employer] 08/29/2011    Past Surgical History  Procedure Laterality Date  . Icd      Guidant- T175 Vitality    Current Outpatient Prescriptions  Medication Sig Dispense Refill  . amiodarone (PACERONE) 200 MG tablet TAKE ONE TABLET BY MOUTH ONE TIME DAILY  30 tablet  1  . aspirin 81 MG tablet Take 81 mg by mouth daily.      . multivitamin-iron-minerals-folic acid (CENTRUM) chewable tablet Chew 1 tablet by mouth daily.      . nitroGLYCERIN (NITROSTAT) 0.4 MG SL tablet Place 0.4 mg under the tongue every 5 (five) minutes as needed.      . sotalol (BETAPACE) 80 MG tablet Take half tablet by mouth twice daily  30 tablet  1   No current facility-administered medications for this visit.    Allergies  Allergen Reactions  . Lidocaine     REACTION: Slow Heartrate    Review of Systems negative except from HPI and PMH  Physical Exam Ht 5\' 9"  (1.753 m)  Wt 141 lb 1.9 oz (64.012 kg)  BMI 20.83 kg/m2 Well developed and well nourished in no acute distress HENT normal E scleral and icterus clear Neck Supple JVP flat; carotids brisk and full Clear to ausculation  Regular rate and rhythm, no murmurs gallops or rub Soft with active bowel sounds No clubbing cyanosis no Edema Alert and oriented, grossly normal motor and sensory function Skin Warm and Dry  NSR with PVCs LBBB Inferior axis with Twave inversion s V1--V4  Assessment and  Plan

## 2013-03-19 ENCOUNTER — Telehealth: Payer: Self-pay | Admitting: Internal Medicine

## 2013-03-19 NOTE — Telephone Encounter (Signed)
I left a message for the patient to call back. This for for follow up to see if he has decided to pursue genetic testing. I have asked that he ask for me if calling back today, otherwise he can speak with Sherri. If he calls back and I am not here, Sherri can forward me his information and I will coordinate his testing if he decides to do this.

## 2013-04-02 ENCOUNTER — Encounter: Payer: Self-pay | Admitting: *Deleted

## 2013-04-02 NOTE — Telephone Encounter (Signed)
Follow up letter mailed to the patient stating if he decided to pursue genetic testing, he could feel free to contact me.

## 2013-06-01 ENCOUNTER — Telehealth: Payer: Self-pay | Admitting: Internal Medicine

## 2013-06-01 NOTE — Telephone Encounter (Signed)
New message  ° ° °Patient calling back to speak with nurse  °

## 2013-06-30 ENCOUNTER — Telehealth: Payer: Self-pay | Admitting: Internal Medicine

## 2013-06-30 NOTE — Telephone Encounter (Signed)
Pt inquiring as to when he needs to have battery changed on his ICD. He called and spoke with hospital about pricing for this and is terrified about his out-of-pocket expense. I discussed with device clinic and pt is around middle-of-battery-life, not ERI as mentioned in January OV with Dr. Graciela Husbands. The patient and I discussed this and I also informed him to call office when he did reach ERI and heard beeping noise from device.  He feels better that he has awhile before needing to worry about this expense and verbalized understanding of conversation.

## 2013-06-30 NOTE — Telephone Encounter (Signed)
Patient would like for you to give him a call. He has something very important that he needs to talk with you about. Please call and advise.

## 2013-07-09 ENCOUNTER — Encounter: Payer: Self-pay | Admitting: Internal Medicine

## 2013-08-05 ENCOUNTER — Other Ambulatory Visit: Payer: Self-pay | Admitting: Internal Medicine

## 2013-09-03 ENCOUNTER — Ambulatory Visit: Payer: 59 | Admitting: *Deleted

## 2013-09-03 NOTE — Progress Notes (Signed)
Remote ICD transmission.   

## 2013-12-07 ENCOUNTER — Ambulatory Visit (INDEPENDENT_AMBULATORY_CARE_PROVIDER_SITE_OTHER): Payer: 59 | Admitting: *Deleted

## 2013-12-07 ENCOUNTER — Encounter: Payer: Self-pay | Admitting: Internal Medicine

## 2013-12-07 DIAGNOSIS — I472 Ventricular tachycardia: Secondary | ICD-10-CM

## 2013-12-07 DIAGNOSIS — I4729 Other ventricular tachycardia: Secondary | ICD-10-CM

## 2013-12-07 LAB — MDC_IDC_ENUM_SESS_TYPE_REMOTE
Brady Statistic RV Percent Paced: 0 %
Implantable Pulse Generator Serial Number: 128083
Lead Channel Impedance Value: 754 Ohm
Lead Channel Sensing Intrinsic Amplitude: 4.4 mV

## 2013-12-07 NOTE — Progress Notes (Signed)
Remote ICD transmission.   

## 2013-12-30 ENCOUNTER — Encounter: Payer: Self-pay | Admitting: Cardiology

## 2014-01-16 ENCOUNTER — Telehealth: Payer: Self-pay | Admitting: Internal Medicine

## 2014-01-16 NOTE — Telephone Encounter (Signed)
I spoke with the patient who says that his defibrillator is "beeping".  He has a [employer] officer, political party implanted 2008. He has been followed remotely and is approaching ERI.  Unfortunately, he is now living in [place] and cannot find his Latitude transmitter (was lost in the move).  He feels well and has received no therapy from his device. As I cannot have him send a remote transmission, I have encouraged him to contact the device clinic on Monday AM to arrange for an in office device check.  This could possibly be done in [place] or [place] if we have a device clinic staff in our satellite on Monday. He will likely need to be scheduled for a generator change with Dr Graciela Husbands (if indeed he is at [facility]). He is instructed to go to the ER if he receives ICD shocks or has any clinical symptoms over the weekend.

## 2014-01-18 ENCOUNTER — Telehealth: Payer: Self-pay | Admitting: *Deleted

## 2014-01-18 NOTE — Telephone Encounter (Signed)
Pt returned my call regarding noise coming from his ICD. Appt made for 11/27@ 1:00pm. Patient states that he is on vacation is unable to come in before Friday. I encouraged him to go to ER if ICD fires. Patient voiced understanding.

## 2014-01-19 ENCOUNTER — Telehealth: Payer: Self-pay | Admitting: Internal Medicine

## 2014-01-19 NOTE — Telephone Encounter (Signed)
New message     ICD device keeps going "off"-----it is beeping.  Please advise

## 2014-01-19 NOTE — Telephone Encounter (Signed)
LMTCB//sss 

## 2014-01-19 NOTE — Telephone Encounter (Signed)
Informed pt that his device will continue to beep until he is seen in the ofc on Friday. Patient voiced understanding.

## 2014-01-20 ENCOUNTER — Telehealth: Payer: Self-pay | Admitting: Internal Medicine

## 2014-01-20 NOTE — Telephone Encounter (Signed)
Error

## 2014-02-09 ENCOUNTER — Ambulatory Visit (INDEPENDENT_AMBULATORY_CARE_PROVIDER_SITE_OTHER): Payer: 59 | Admitting: *Deleted

## 2014-02-09 DIAGNOSIS — I428 Other cardiomyopathies: Secondary | ICD-10-CM

## 2014-02-09 DIAGNOSIS — Z95810 Presence of automatic (implantable) cardiac defibrillator: Secondary | ICD-10-CM

## 2014-02-09 LAB — MDC_IDC_ENUM_SESS_TYPE_INCLINIC
Battery Voltage: 2.52 V
Brady Statistic RV Percent Paced: 0 %
Date Time Interrogation Session: 20151215050000
HighPow Impedance: 38 Ohm
HighPow Impedance: 51 Ohm
Implantable Pulse Generator Serial Number: 128083
Lead Channel Impedance Value: 733 Ohm
Lead Channel Pacing Threshold Amplitude: 1 V
Lead Channel Pacing Threshold Amplitude: 1.2 V
Lead Channel Pacing Threshold Amplitude: 1.2 V
Lead Channel Pacing Threshold Amplitude: 1.4 V
Lead Channel Pacing Threshold Pulse Width: 0.4 ms
Lead Channel Pacing Threshold Pulse Width: 0.4 ms
Lead Channel Pacing Threshold Pulse Width: 0.4 ms
Lead Channel Pacing Threshold Pulse Width: 0.4 ms
Lead Channel Sensing Intrinsic Amplitude: 3.9 mV
Lead Channel Setting Pacing Amplitude: 2.4 V
Lead Channel Setting Pacing Pulse Width: 0.4 ms
Zone Setting Detection Interval: 250 ms
Zone Setting Detection Interval: 300 ms
Zone Setting Detection Interval: 375 ms

## 2014-02-09 NOTE — Progress Notes (Signed)
ICD check in clinic. Device reached ERI 01/15/14---pt heard device beeping 2x/day but did not call clinic. Pt does not have Latitude at current residence. No evidence of any ventricular arrhythmias. Histogram distribution appropriate for patient and level of activity. Device programmed at appropriate safety margins. Device programmed to optimize intrinsic conduction. Turned off ERI notifier. ROV w/ Dr. Graciela Husbands 03/30/14.

## 2014-02-16 ENCOUNTER — Other Ambulatory Visit: Payer: Self-pay

## 2014-02-16 MED ORDER — AMIODARONE HCL 200 MG PO TABS: 200.0000 mg | ORAL_TABLET | Freq: Every day | ORAL | Status: DC

## 2014-02-24 ENCOUNTER — Encounter: Payer: Self-pay | Admitting: Internal Medicine

## 2014-03-29 ENCOUNTER — Telehealth: Payer: Self-pay | Admitting: *Deleted

## 2014-03-29 NOTE — Telephone Encounter (Signed)
Pt called to reschedule his appointment w/ Dr. Graciela Husbands to discuss ICD changeout due to ERI on 01/15/14. Pt wanted to delay his appt due to concerns about out of pocket costs. Pt also needs to clear time off from his job. Pt concerned costs will be approx $8k out of pocket. He wants to ask the cost estimate w/ Dr. Graciela Husbands. I advised the pt to contact his insurance company to obtain information based on a single chamber ICD changeout w/out overnight stay. I let pt know that most, but not all, changeouts tend to be outpatient surgery. Also, I let him know Dr. Graciela Husbands could not quote an exact cost due to variances amongst insurance companies. Pt expressed understanding.   I explained to pt that the risk of having his device in a depleted state of battery life could put him at risk for his ICD not functioning properly if he needed arrhythmia intervention. Pt expressed understanding. Pt still requests to reschedule his time w/ Dr. Graciela Husbands.

## 2014-03-30 ENCOUNTER — Encounter: Payer: 59 | Admitting: Internal Medicine

## 2014-04-05 ENCOUNTER — Encounter: Payer: Self-pay | Admitting: Internal Medicine

## 2014-04-16 ENCOUNTER — Ambulatory Visit (INDEPENDENT_AMBULATORY_CARE_PROVIDER_SITE_OTHER): Payer: Managed Care, Other (non HMO) | Admitting: Internal Medicine

## 2014-04-16 ENCOUNTER — Encounter: Payer: Self-pay | Admitting: Internal Medicine

## 2014-04-16 VITALS — BP 120/90 | HR 63 | Ht 69.0 in | Wt 145.0 lb

## 2014-04-16 DIAGNOSIS — I4729 Other ventricular tachycardia: Secondary | ICD-10-CM

## 2014-04-16 DIAGNOSIS — Z95810 Presence of automatic (implantable) cardiac defibrillator: Secondary | ICD-10-CM

## 2014-04-16 DIAGNOSIS — I428 Other cardiomyopathies: Secondary | ICD-10-CM

## 2014-04-16 DIAGNOSIS — I472 Ventricular tachycardia: Secondary | ICD-10-CM

## 2014-04-16 NOTE — Patient Instructions (Signed)
We will check into financial assistance through the hospital for ICD generator change. Jediah Horger, RN will call you next week with more details.

## 2014-04-16 NOTE — Progress Notes (Signed)
      Patient Care Team: Minda Meo, MD as PCP - General (Internal Medicine)   HPI  Ernest Morgan is a 49 y.o. 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 He is doing combination of amiodarone and sotalol for some time now. Genetic testing was discussed at his last visit but was not consummated.  He denies intercurrent ventricular tachycardia.  He is taking his medications; he knows his device has reached ERI. He is told was out-of-pocket expenses for generator replacement are in the order 6-$8000.  Past Medical History  Diagnosis Date  . Ventricular tachycardia   . Arrhythmogenic right ventricular cardiomyopathy 01/28/2012  . ICD -[employer] 08/29/2011    Past Surgical History  Procedure Laterality Date  . Icd      Guidant- T175 Vitality    Current Outpatient Prescriptions  Medication Sig Dispense Refill  . amiodarone (PACERONE) 200 MG tablet Take 1 tablet (200 mg total) by mouth daily. 30 tablet 1  . aspirin 81 MG tablet Take 81 mg by mouth daily.    . sotalol (BETAPACE) 80 MG tablet TAKE HALF TABLET BY MOUTH TWICE DAILY  30 tablet 5   No current facility-administered medications for this visit.    Allergies  Allergen Reactions  . Lidocaine     REACTION: Slow Heartrate    Review of Systems negative except from HPI and PMH  Physical Exam BP 120/90 mmHg  Pulse 63  Ht 5\' 9"  (1.753 m)  Wt 145 lb (65.772 kg)  BMI 21.40 kg/m2 Well developed and well nourished in no acute distress HENT normal E scleral and icterus clear Neck Supple JVP flat; carotids brisk and full Clear to ausculation  Regular rate and rhythm, no murmurs gallops or rub Soft with active bowel sounds No clubbing cyanosis no Edema Alert and oriented, grossly normal motor and sensory function Skin Warm and Dry  NSR with PVCs LBBB Inferior axis with Twave inversion s V1--V4  Assessment and  Plan  Arrhythmogenic RV  cardiomyopathy  Ventricular tachycardia with history of storm  Implantable defibrillator at [facility]  The patient has ARVC without recent intercurrent ventricular tachycardia. Surveillance laboratories are followed by Dr. Jacky Kindle (will check with him to make sure0 His device has reached ERI. He is concerned about the cost replacement. I suggested that he discuss this issue with [facility] to come up with some plan. We also had a lengthy discussion regarding the genetic implications as relates to his 2 daughters, one of whose  sons has had problems with palpitations    We spent more than 50% of our >45 min visit in face to face counseling regarding the above

## 2014-04-21 ENCOUNTER — Telehealth: Payer: Self-pay | Admitting: Internal Medicine

## 2014-04-21 NOTE — Telephone Encounter (Signed)
Follow up:    Per pt returning Sherri's call please give him a call back.   Pt did not elaborate.

## 2014-04-21 NOTE — Telephone Encounter (Signed)
Follow up;     Per pt he is returning Sherri's call pt would not elaborate.

## 2014-04-22 NOTE — Telephone Encounter (Signed)
Informed patient that I had financial assistance paperwork from the hospital. He is aware to fill out all spaces on form, including one that is a 0. He will mail it back to hospital and call me when he does. He is aware I will be in touch with the hospital once he mails paperwork.

## 2014-04-22 NOTE — Telephone Encounter (Signed)
F/u ° ° °Pt returning your call. Please call pt. °

## 2014-04-28 ENCOUNTER — Other Ambulatory Visit: Payer: Self-pay | Admitting: Internal Medicine

## 2014-05-01 ENCOUNTER — Other Ambulatory Visit: Payer: Self-pay | Admitting: Cardiology

## 2014-05-01 MED ORDER — AMIODARONE HCL 200 MG PO TABS: 200.0000 mg | ORAL_TABLET | Freq: Every day | ORAL | Status: DC

## 2014-05-07 ENCOUNTER — Telehealth: Payer: Self-pay | Admitting: Internal Medicine

## 2014-05-07 NOTE — Telephone Encounter (Signed)
New message     Pt c/o Shortness Of Breath: STAT if SOB developed within the last 24 hours or pt is noticeably SOB on the phone  1. Are you currently SOB (can you hear that pt is SOB on the phone)?no 2. How long have you been experiencing SOB? Started today 3. Are you SOB when sitting or when up moving around? Moving around  4. Are you currently experiencing any other symptoms? no

## 2014-05-07 NOTE — Telephone Encounter (Signed)
Patient states that while at work this morning he got very short winded and stayed that way for awhile. Patient reports from 9 am to 12:45 pm.  He complained of feeling a tightness is his upper abdomen during this event. He is asking to be seen today but I explained that we do not have an opening on anyone's schedule.  Instead advised patient to call PCP or go to nearest emergency room to be evaluated.  Patient agreed to contact PCP first as he can usually see him quickly and if not he would go to ED. Patient agreed to call me back next week for device interrogation to rule out arrhythmia cause if he didn't have device interrogated today/over weekend.

## 2014-05-08 ENCOUNTER — Encounter (HOSPITAL_COMMUNITY): Payer: Self-pay | Admitting: Emergency Medicine

## 2014-05-08 ENCOUNTER — Inpatient Hospital Stay (HOSPITAL_COMMUNITY)
Admission: AD | Admit: 2014-05-08 | Discharge: 2014-05-11 | DRG: 243 | Disposition: A | Discharge status: Home / Self Care | Payer: Managed Care, Other (non HMO) | Source: Other Acute Inpatient Hospital | Attending: Internal Medicine | Admitting: Internal Medicine

## 2014-05-08 DIAGNOSIS — I493 Ventricular premature depolarization: Secondary | ICD-10-CM | POA: Diagnosis present

## 2014-05-08 DIAGNOSIS — F17210 Nicotine dependence, cigarettes, uncomplicated: Secondary | ICD-10-CM | POA: Diagnosis present

## 2014-05-08 DIAGNOSIS — Z884 Allergy status to anesthetic agent status: Secondary | ICD-10-CM | POA: Diagnosis not present

## 2014-05-08 DIAGNOSIS — I4729 Other ventricular tachycardia: Secondary | ICD-10-CM

## 2014-05-08 DIAGNOSIS — I428 Other cardiomyopathies: Secondary | ICD-10-CM

## 2014-05-08 DIAGNOSIS — I472 Ventricular tachycardia, unspecified: Secondary | ICD-10-CM

## 2014-05-08 DIAGNOSIS — Z95810 Presence of automatic (implantable) cardiac defibrillator: Secondary | ICD-10-CM

## 2014-05-08 DIAGNOSIS — I429 Cardiomyopathy, unspecified: Secondary | ICD-10-CM | POA: Diagnosis present

## 2014-05-08 LAB — BASIC METABOLIC PANEL
Anion gap: 7 (ref 5–15)
BUN: 15 mg/dL (ref 6–23)
CO2: 21 mmol/L (ref 19–32)
Calcium: 8.3 mg/dL — ABNORMAL LOW (ref 8.4–10.5)
Chloride: 109 mmol/L (ref 96–112)
Creatinine, Ser: 1.02 mg/dL (ref 0.50–1.35)
GFR calc Af Amer: >90 mL/min (ref >90)
GFR calc non Af Amer: 83 mL/min — ABNORMAL LOW (ref >90)
Glucose, Bld: 106 mg/dL — ABNORMAL HIGH (ref 70–99)
Potassium: 3.8 mmol/L (ref 3.5–5.1)
Sodium: 137 mmol/L (ref 135–145)

## 2014-05-08 LAB — TROPONIN I: Troponin I: 0.27 ng/mL — ABNORMAL HIGH (ref <0.031)

## 2014-05-08 LAB — MRSA PCR SCREENING: MRSA by PCR: NEGATIVE

## 2014-05-08 LAB — CBC
HCT: 37.2 % — ABNORMAL LOW (ref 39.0–52.0)
Hemoglobin: 13.2 g/dL (ref 13.0–17.0)
MCH: 33.4 pg (ref 26.0–34.0)
MCHC: 35.5 g/dL (ref 30.0–36.0)
MCV: 94.2 fL (ref 78.0–100.0)
Platelets: 174 10*3/uL (ref 150–400)
RBC: 3.95 MIL/uL — ABNORMAL LOW (ref 4.22–5.81)
RDW: 12.2 % (ref 11.5–15.5)
WBC: 7 10*3/uL (ref 4.0–10.5)

## 2014-05-08 LAB — TSH: TSH: 0.948 u[IU]/mL (ref 0.350–4.500)

## 2014-05-08 LAB — MAGNESIUM: Magnesium: 1.9 mg/dL (ref 1.5–2.5)

## 2014-05-08 MED ORDER — ASPIRIN 81 MG PO TABS: 81.0000 mg | ORAL_TABLET | Freq: Every day | ORAL | Status: DC

## 2014-05-08 MED ORDER — NITROGLYCERIN 0.4 MG SL SUBL: 0.4000 mg | SUBLINGUAL_TABLET | SUBLINGUAL | Status: DC | PRN

## 2014-05-08 MED ORDER — PNEUMOCOCCAL VAC POLYVALENT 25 MCG/0.5ML IJ INJ
0.5000 mL | INJECTION | INTRAMUSCULAR | Status: DC
  Filled 2014-05-08: qty 0.5

## 2014-05-08 MED ORDER — HEPARIN SODIUM (PORCINE) 5000 UNIT/ML IJ SOLN
5000.0000 [IU] | Freq: Three times a day (TID) | INTRAMUSCULAR | Status: DC
  Administered 2014-05-08 – 2014-05-10 (×6): 5000 [IU] via SUBCUTANEOUS
  Filled 2014-05-08 (×10): qty 1

## 2014-05-08 MED ORDER — AMIODARONE HCL 200 MG PO TABS
200.0000 mg | ORAL_TABLET | Freq: Every day | ORAL | Status: DC
  Administered 2014-05-09 – 2014-05-11 (×3): 200 mg via ORAL
  Filled 2014-05-08 (×4): qty 1

## 2014-05-08 MED ORDER — ONDANSETRON HCL 4 MG/2ML IJ SOLN: 4.0000 mg | Freq: Four times a day (QID) | INTRAMUSCULAR | Status: DC | PRN

## 2014-05-08 MED ORDER — SODIUM CHLORIDE 0.9 % IV SOLN: 250.0000 mL | INTRAVENOUS | Status: DC | PRN

## 2014-05-08 MED ORDER — ASPIRIN EC 81 MG PO TBEC
81.0000 mg | DELAYED_RELEASE_TABLET | Freq: Every day | ORAL | Status: DC
  Administered 2014-05-09 – 2014-05-11 (×3): 81 mg via ORAL
  Filled 2014-05-08 (×3): qty 1

## 2014-05-08 MED ORDER — ASPIRIN 300 MG RE SUPP: 300.0000 mg | RECTAL | Status: AC

## 2014-05-08 MED ORDER — ASPIRIN 81 MG PO CHEW: 324.0000 mg | CHEWABLE_TABLET | ORAL | Status: AC

## 2014-05-08 MED ORDER — ACETAMINOPHEN 325 MG PO TABS
650.0000 mg | ORAL_TABLET | ORAL | Status: DC | PRN
  Administered 2014-05-10 – 2014-05-11 (×3): 650 mg via ORAL
  Filled 2014-05-08 (×3): qty 2

## 2014-05-08 MED ORDER — ALPRAZOLAM 0.25 MG PO TABS: 0.2500 mg | ORAL_TABLET | Freq: Two times a day (BID) | ORAL | Status: DC | PRN

## 2014-05-08 MED ORDER — ZOLPIDEM TARTRATE 5 MG PO TABS
5.0000 mg | ORAL_TABLET | Freq: Every evening | ORAL | Status: DC | PRN
  Administered 2014-05-10: 5 mg via ORAL
  Filled 2014-05-08: qty 1

## 2014-05-08 MED ORDER — SODIUM CHLORIDE 0.9 % IJ SOLN
3.0000 mL | INTRAMUSCULAR | Status: DC | PRN
  Administered 2014-05-09: 3 mL via INTRAVENOUS
  Filled 2014-05-08: qty 3

## 2014-05-08 MED ORDER — SODIUM CHLORIDE 0.9 % IJ SOLN
3.0000 mL | Freq: Two times a day (BID) | INTRAMUSCULAR | Status: DC
  Administered 2014-05-08 – 2014-05-10 (×4): 3 mL via INTRAVENOUS

## 2014-05-08 MED ORDER — SOTALOL HCL 80 MG PO TABS
40.0000 mg | ORAL_TABLET | Freq: Two times a day (BID) | ORAL | Status: DC
  Administered 2014-05-08 – 2014-05-11 (×7): 40 mg via ORAL
  Filled 2014-05-08 (×9): qty 0.5

## 2014-05-08 NOTE — H&P (Signed)
Patient ID: Ernest Morgan MRN: [id number], DOB/AGE: 1965-04-04   Admit date: 05/08/2014  Primary Physician: Ernest Meo, MD Primary Cardiologist: Dr Ernest Morgan  HPI: 49 y.o. Male followed by Dr Ernest Morgan with a history of ventricular tachycardia in the setting of RV dysplasia. Echo in March 2011 showed an EF of 60% with moderate to severe RVD. He had an ICD implanted in 2008 (which had to be replaced within a month or so secondary to FDA recall). He has been treated with a combination of amiodarone and sotalol for some time now. Recently his device was sounding alerts (Nov 2015) and is felt to be at EOL. The pt was told he would have to pay 6-8k out of pocket and hasn't had this done yet. He has filed some paperwork with [facility] for assistance.   He had been doing well until yesterday when he noted increasing DOE after cleaning his car. Last night he thought his symptoms were worse when he lay on his left side, better when he lay on his back. Today his girlfriend convinced him to go to the ED.They live in [place] now and he went to the ED there. In the ED he was noted to be in slow VT. He became hypotensive and was shocked once. He is transferred now for further Rx and evaluation.   The pt says he has been compliant with his medications. He denies OTC medication use. He says he has been doing fine till yesterday.         Problem List: Past Medical History  Diagnosis Date  . Ventricular tachycardia   . Arrhythmogenic right ventricular cardiomyopathy   . ICD -[employer]     Past Surgical History  Procedure Laterality Date  . Cardiac defibrillator placement      Guidant - T175 Vitality     Allergies:  Allergies  Allergen Reactions  . Lidocaine     REACTION: Slow Heartrate    Home Medications Current Facility-Administered Medications  Medication Dose Route Frequency Provider Last Rate Last Dose  . 0.9 %  sodium chloride infusion  250 mL Intravenous PRN Ernest Derrick,  PA-C      . acetaminophen (TYLENOL) tablet 650 mg  650 mg Oral Q4H PRN Ernest Derrick, PA-C      . ALPRAZolam Prudy Feeler) tablet 0.25 mg  0.25 mg Oral BID PRN Ernest Derrick, PA-C      . amiodarone (PACERONE) tablet 200 mg  200 mg Oral Daily Ernest K Kilroy, PA-C      . aspirin chewable tablet 324 mg  324 mg Oral NOW Ernest Derrick, PA-C       Or  . aspirin suppository 300 mg  300 mg Rectal NOW Ernest Derrick, PA-C      . [START ON 05/09/2014] aspirin EC tablet 81 mg  81 mg Oral Daily Ernest K Kilroy, PA-C      . [START ON 05/09/2014] aspirin tablet 81 mg  81 mg Oral Daily Ernest K Kilroy, PA-C      . heparin injection 5,000 Units  5,000 Units Subcutaneous 3 times per day Ernest K Kilroy, PA-C      . nitroGLYCERIN (NITROSTAT) SL tablet 0.4 mg  0.4 mg Sublingual Q5 Min x 3 PRN Ernest Paschal Kilroy, PA-C      . ondansetron [facility]) injection 4 mg  4 mg Intravenous Q6H PRN Ernest Derrick, PA-C      . [START ON 05/09/2014] pneumococcal 23 valent vaccine (  PNU-IMMUNE) injection 0.5 mL  0.5 mL Intramuscular Tomorrow-1000 Ernest Maw, MD      . sodium chloride 0.9 % injection 3 mL  3 mL Intravenous Q12H Ernest K Kilroy, PA-C      . sodium chloride 0.9 % injection 3 mL  3 mL Intravenous PRN Ernest Paschal Kilroy, PA-C      . sotalol (BETAPACE) tablet 40 mg  40 mg Oral Q12H Ernest K Kilroy, PA-C      . zolpidem (AMBIEN) tablet 5 mg  5 mg Oral QHS PRN,MR X 1 Ernest Derrick, PA-C         Family History  Problem Relation Age of Onset  . Heart disease Mother   . Diabetes Mother   . Diabetes Father      History   Social History  . Marital Status: Married    Spouse Name: N/A  . Number of Children: N/A  . Years of Education: N/A   Occupational History  . Not on file.   Social History Main Topics  . Smoking status: Current Every Day Smoker -- 1.00 packs/day for 10 years    Types: Cigarettes  . Smokeless tobacco: Never Used  . Alcohol Use: No  . Drug Use: No  . Sexual Activity: Yes   Other Topics Concern  . Not on file    Social History Narrative     Review of Systems: General: negative for chills, fever, night sweats or weight changes.  Cardiovascular: negative for chest pain, dyspnea on exertion, edema, orthopnea, palpitations, paroxysmal nocturnal dyspnea or shortness of breath Dermatological: negative for rash Respiratory: negative for cough or wheezing Urologic: negative for hematuria Abdominal: negative for nausea, vomiting, diarrhea, bright red blood per rectum, melena, or hematemesis Neurologic: negative for visual changes, syncope, or dizziness All other systems reviewed and are otherwise negative except as noted above.  Physical Exam: There were no vitals taken for this visit.  General appearance: alert, cooperative and no distress Neck: no carotid bruit and no JVD Lungs: clear to auscultation bilaterally Heart: regular rate and rhythm Abdomen: soft, non-tender; bowel sounds normal; no masses,  no organomegaly Extremities: extremities normal, atraumatic, no cyanosis or edema Pulses: 2+ and symmetric Skin: Skin color, texture, turgor normal. No rashes or lesions Neurologic: Grossly normal   Labs:  No results found for this or any previous visit (from the past 24 hour(s)).  Done in ED in [place]- K+ 3.9, SCr 1.3, Hgb 16, plts, 211, Troponin 0.17, BNP 763   CXR- NAD  EKG: NSR. TWI and ST depression 2,3,F, V3-V6  ASSESSMENT AND PLAN:  Principal Problem:   VENTRICULAR TACHYCARDIA Active Problems:   Automatic implantable cardioverter-defibrillator in situ   Arrhythmogenic right ventricular cardiomyopathy   VT (ventricular tachycardia)   PLAN: Interrogate device, continue home meds. EP to see in am.    Signed, Ernest Shelter, PA-C 05/08/2014, 2:15 PM    Attending note:  Patient seen and examined. Reviewed records and discussed the case with Ernest Morgan. Ernest Morgan is a patient of Dr. Graciela Morgan with a history of right ventricular dysplasia and recurrent ventricular tachycardia. He  has a [employer]. Jude ICD in place, and has also been treated long-term with both amiodarone and sotalol. His device has reached ERI, however he has not pursued a generator change due to financial constraints. He reports being in his usual state of health until Friday, had washed his car, and then suddenly began to feel short of breath just with routine walking. He does  not endorse any chest pain or palpitations, mainly substantial feeling of fatigue and breathlessness. This occurred throughout the evening, even at nighttime when he would change positions and shift in bed, encouraged to go to the ER by his girlfriend. He was seen at an outside facility in [place] and noted to be in slow ventricular tachycardia, ECG reviewed. He ultimately required cardioversion in the ER due to relative hypotension, and has remained in sinus rhythm since that time, transferred to our facility. Device interrogation is pending at this time. Dr. Ladona Ridgel informed of the patient's hospitalization and will be seeing him for EP input tomorrow. We will obtain follow-up lab work and otherwise plan to continue both amiodarone and sotalol for now.  Jonelle Sidle, M.D., F.A.C.C.

## 2014-05-08 NOTE — Progress Notes (Signed)
Ernest Morgan [phone] VVI ICD implanted on 05-14-2006.  Went ERI on Jan 15, 2014.  Now Battery Voltage is 2.43 and charge time 18.9s.  [employer] advised immediate change out.  Lowest zone is set to 160bpm but there is no evidence of any stored EGMs since the day of implant. Thus nothing was stored on 05/08/14 when he recorded this fast episode at greater than 200bpm.  Lead parameters were tested and were normal and consistent.  R waves have been between 3-4 for the last 12 months on daily measurements.  ([name])   Charted under my access.  Melodye Ped

## 2014-05-09 LAB — TROPONIN I: Troponin I: 0.31 ng/mL — ABNORMAL HIGH (ref <0.031)

## 2014-05-09 NOTE — Progress Notes (Signed)
  Echocardiogram 2D Echocardiogram has been performed.  Arvil Chaco 05/09/2014, 11:44 AM

## 2014-05-09 NOTE — Progress Notes (Signed)
Utilization Review Completed.   Dale Ribeiro, RN, BSN Nurse Case Manager  

## 2014-05-09 NOTE — Consult Note (Signed)
   Reason for Consult:VT in the setting of a depleted ICD  Referring Physician: McDowell  Ernest Morgan is an 49 y.o. male.   HPI: The patient is a 49 yo man with an extensive PMHX notable for ARVD and VT, s/p ICD implant. He was found to be in sustained VT at 140/min. He device did not treat his arrhythmia and he was sedated and cardioverted. He was transferred for additional treatment. He device is not communicable due to EOL. He had avoided having the device changed out due to expense concerns.   PMH: Past Medical History  Diagnosis Date  . Ventricular tachycardia   . Arrhythmogenic right ventricular cardiomyopathy   . ICD -[employer]     PSHX: Past Surgical History  Procedure Laterality Date  . Cardiac defibrillator placement      Guidant - T175 Vitality    FAMHX: Family History  Problem Relation Age of Onset  . Heart disease Mother   . Diabetes Mother   . Diabetes Father     Social History:  reports that he has been smoking Cigarettes.  He has a 10 pack-year smoking history. He has never used smokeless tobacco. He reports that he does not drink alcohol or use illicit drugs.  Allergies:  Allergies  Allergen Reactions  . Lidocaine     REACTION: Slow Heartrate    Medications: reviewed and include both sotalol and amiodarone.   No results found.  ROS  As stated in the HPI and negative for all other systems.  Physical Exam  Vitals:Blood pressure 98/67, pulse 55, temperature 97.8 F (36.6 C), temperature source Oral, SpO2 96 %.  Well appearing middle aged man, NAD HEENT: Unremarkable Neck:  No JVD, no thyromegally Lymphatics:  No adenopathy Back:  No CVA tenderness Lungs:  Clear with no wheezes HEART:  Regular rate rhythm, no murmurs, no rubs, no clicks Abd:  Flat, positive bowel sounds, no organomegally, no rebound, no guarding Ext:  2 plus pulses, no edema, no cyanosis, no clubbing Skin:  No rashes no nodules Neuro:  CN II through XII intact,  motor grossly intact  ECG - sinus rhythm ECG - VT (LBB), superior axis  Assessment/Plan: 1. ICD at EOL 2. Sustained VT, requiring Cardioversion 3. ARVD Rec: continue current meds. Will change out and reprogram his ICD tomorrow. I anticipate the need for an atrial lead.  Gregg TaylorMD 05/09/2014, 7:46 AM      

## 2014-05-10 ENCOUNTER — Encounter (HOSPITAL_COMMUNITY)
Admission: AD | Disposition: A | Discharge status: Home / Self Care | Payer: Self-pay | Source: Other Acute Inpatient Hospital | Attending: Internal Medicine

## 2014-05-10 DIAGNOSIS — I471 Supraventricular tachycardia: Secondary | ICD-10-CM

## 2014-05-10 DIAGNOSIS — Z4502 Encounter for adjustment and management of automatic implantable cardiac defibrillator: Secondary | ICD-10-CM

## 2014-05-10 HISTORY — PX: IMPLANTABLE CARDIOVERTER DEFIBRILLATOR (ICD) GENERATOR CHANGE: SHX5469

## 2014-05-10 SURGERY — ICD GENERATOR CHANGE
Anesthesia: LOCAL

## 2014-05-10 MED ORDER — HEPARIN (PORCINE) IN NACL 2-0.9 UNIT/ML-% IJ SOLN
INTRAMUSCULAR | Status: AC
  Filled 2014-05-10: qty 500

## 2014-05-10 MED ORDER — MIDAZOLAM HCL 5 MG/5ML IJ SOLN
INTRAMUSCULAR | Status: AC
  Filled 2014-05-10: qty 5

## 2014-05-10 MED ORDER — CEFAZOLIN SODIUM-DEXTROSE 2-3 GM-% IV SOLR
2.0000 g | INTRAVENOUS | Status: DC
  Filled 2014-05-10: qty 50

## 2014-05-10 MED ORDER — FENTANYL CITRATE 0.05 MG/ML IJ SOLN
INTRAMUSCULAR | Status: AC
  Filled 2014-05-10: qty 2

## 2014-05-10 MED ORDER — ACETAMINOPHEN 325 MG PO TABS: 325.0000 mg | ORAL_TABLET | ORAL | Status: DC | PRN

## 2014-05-10 MED ORDER — CEFAZOLIN SODIUM-DEXTROSE 2-3 GM-% IV SOLR
2.0000 g | Freq: Four times a day (QID) | INTRAVENOUS | Status: AC
  Administered 2014-05-10 (×2): 2 g via INTRAVENOUS
  Filled 2014-05-10 (×3): qty 50

## 2014-05-10 MED ORDER — SODIUM CHLORIDE 0.45 % IV SOLN
INTRAVENOUS | Status: DC
  Administered 2014-05-10: 11:00:00 via INTRAVENOUS

## 2014-05-10 MED ORDER — SODIUM CHLORIDE 0.9 % IV SOLN: INTRAVENOUS | Status: DC

## 2014-05-10 MED ORDER — CHLORHEXIDINE GLUCONATE 4 % EX LIQD
CUTANEOUS | Status: AC
Start: 2014-05-10 — End: 2014-05-10
  Filled 2014-05-10: qty 15

## 2014-05-10 MED ORDER — CHLORHEXIDINE GLUCONATE 4 % EX LIQD
60.0000 mL | Freq: Once | CUTANEOUS | Status: AC
  Administered 2014-05-10: 4 via TOPICAL

## 2014-05-10 MED ORDER — SODIUM CHLORIDE 0.9 % IR SOLN
80.0000 mg | Status: DC
  Filled 2014-05-10: qty 2

## 2014-05-10 MED ORDER — ONDANSETRON HCL 4 MG/2ML IJ SOLN: 4.0000 mg | Freq: Four times a day (QID) | INTRAMUSCULAR | Status: DC | PRN

## 2014-05-10 MED ORDER — CHLOROPROCAINE HCL 2 % IJ SOLN
60.0000 mL | Freq: Once | INTRAMUSCULAR | Status: DC
  Filled 2014-05-10: qty 60

## 2014-05-10 NOTE — H&P (View-Only) (Signed)
   Reason for Consult:VT in the setting of a depleted ICD  Referring Physician: Tanisha Edelman is an 49 y.o. male.   HPI: The patient is a 50 yo man with an extensive PMHX notable for ARVD and VT, s/p ICD implant. He was found to be in sustained VT at 140/min. He device did not treat his arrhythmia and he was sedated and cardioverted. He was transferred for additional treatment. He device is not communicable due to EOL. He had avoided having the device changed out due to expense concerns.   PMH: Past Medical History  Diagnosis Date  . Ventricular tachycardia   . Arrhythmogenic right ventricular cardiomyopathy   . ICD -[employer]     PSHX: Past Surgical History  Procedure Laterality Date  . Cardiac defibrillator placement      Guidant - T175 Vitality    FAMHX: Family History  Problem Relation Age of Onset  . Heart disease Mother   . Diabetes Mother   . Diabetes Father     Social History:  reports that he has been smoking Cigarettes.  He has a 10 pack-year smoking history. He has never used smokeless tobacco. He reports that he does not drink alcohol or use illicit drugs.  Allergies:  Allergies  Allergen Reactions  . Lidocaine     REACTION: Slow Heartrate    Medications: reviewed and include both sotalol and amiodarone.   No results found.  ROS  As stated in the HPI and negative for all other systems.  Physical Exam  Vitals:Blood pressure 98/67, pulse 55, temperature 97.8 F (36.6 C), temperature source Oral, SpO2 96 %.  Well appearing middle aged man, NAD HEENT: Unremarkable Neck:  No JVD, no thyromegally Lymphatics:  No adenopathy Back:  No CVA tenderness Lungs:  Clear with no wheezes HEART:  Regular rate rhythm, no murmurs, no rubs, no clicks Abd:  Flat, positive bowel sounds, no organomegally, no rebound, no guarding Ext:  2 plus pulses, no edema, no cyanosis, no clubbing Skin:  No rashes no nodules Neuro:  CN II through XII intact,  motor grossly intact  ECG - sinus rhythm ECG - VT (LBB), superior axis  Assessment/Plan: 1. ICD at EOL 2. Sustained VT, requiring Cardioversion 3. ARVD Rec: continue current meds. Will change out and reprogram his ICD tomorrow. I anticipate the need for an atrial lead.  Sharlot Gowda TaylorMD 05/09/2014, 7:46 AM

## 2014-05-10 NOTE — CV Procedure (Signed)
EP Procedure Note  Procedure: ICD removal and insertion of a new ICD along with a new RA lead, and ICD pocket revision with DFT testing  Preoperative Diagnosis: ICD at EOL, sinus node dysfunction, VT due to ARVD  Postoperative Diagnosis: same as preoperative diagnosis  Description of the procedure: After informed consent was obtained, the patient was taken to the EP lab in the fasting state. After the usual preparation and draping IV Versed and Fentanyl was used for sedation. 30 cc of lidocaine was infiltrated into the left infraclavicular region. A 7 cm incision was carried out. Electrocautery was utilized to dissect down to the ICD pocket. Electrocautery was utilized to free up the defibrillator lead from its dense fibrous adhesions. The floor of the pocket was excised. The left subclavian vein was punctured and a Medtronic model 5076 active fixation atrial pacing lead, serial number [id number] was advanced under fluoroscopic guidance into the right atrium. Mapping was carried out. The P wave measured 5 mV. The pacing impedence was 673 ohms. The threshold was 0.8 volts at 0.5 ms. 10 volt pacing did not stimulate the diaphragm. With the satisfactory parameters, the old [employer] ICD which had reached end of life, was connected to the old ICD lead and the new atrial pacing lead. At this point a combination of blunt dissection and electrocautery was utilized to dissect through the pectoralis major and fashion a subpectoral pocket. Electrocautery was utilized to assure hemostasis. The new [employer] dual-chamber ICD, serial [id number] was connected to the new and old lead and placed into the subpectoral pocket. The pocket was irrigated with antibiotic irrigation. The pectoralis major was reapproximated. The deep tissue was sewn together with Vicryl suture, 3 layers. Pressure was held and the patient was more deep was sedated under my direct supervision for defibrillation threshold testing.  After  the patient was more deep was sedated with fentanyl and Versed, ventricular fibrillation was induced with a T-wave shock. A 14 J shock was delivered, terminated ventricular fibrillation, and restoring sinus rhythm. No additional defibrillation threshold testing was carried out. The patient was returned to the recovery area in satisfactory condition.  Complications: There were no immediate procedure compilations  Conclusion: Successful removal of a previously implanted ICD which was at end-of-life, successful insertion of a new atrial pacing lead with insertion of a new ICD with defibrillation threshold testing and ICD pocket revision. The fibrillation threshold equal 14 J.  Lewayne Bunting, M.D.

## 2014-05-10 NOTE — Interval H&P Note (Signed)
History and Physical Interval Note:  05/10/2014 7:31 AM  Ernest Morgan  has presented today for surgery, with the diagnosis of EOL  The various methods of treatment have been discussed with the patient and family. After consideration of risks, benefits and other options for treatment, the patient has consented to  Procedure(s): ICD GENERATOR CHANGE (N/A) with insertion of a atrial lead as a surgical intervention .  The patient's history has been reviewed, patient examined, no change in status, stable for surgery.  I have reviewed the patient's chart and labs.  Questions were answered to the patient's satisfaction.     [name].D.

## 2014-05-10 NOTE — H&P (Signed)
  ICD Criteria  Current LVEF:55% ;Obtained > 6 months ago.   NYHA Functional Classification: Class I  Heart Failure History:  No.  Non-Ischemic Dilated Cardiomyopathy History:  No.  Atrial Fibrillation/Atrial Flutter:  No.  Ventricular Tachycardia History:  Yes, Hemodynamic instability present, VT Type:  SVT - Monomorphic.  Cardiac Arrest History:  No  History of Syndromes with Risk of Sudden Death:  Yes, Other  Previous ICD:  Yes, ICD Type:  Single, Reason for ICD:  Secondary, reason for secondary prevention:  Ventricular Tachycardia  Electrophysiology Study: Yes, EP Study timeframe: > 6 months, Ventricular arrhythmias induced,  VT ablation NOT performed.  Prior MI: No.  PPM: No.  OSA:  No  Patient Life Expectancy of >=1 year: Yes.  Anticoagulation Therapy:  Patient is NOT on anticoagulation therapy.   Beta Blocker Therapy:  Yes.   Ace Inhibitor/ARB Therapy:  No, Reason not on Ace Inhibitor/ARB therapy:  normal lv function

## 2014-05-11 ENCOUNTER — Encounter (HOSPITAL_COMMUNITY): Payer: Self-pay | Admitting: Internal Medicine

## 2014-05-11 ENCOUNTER — Inpatient Hospital Stay (HOSPITAL_COMMUNITY): Payer: Managed Care, Other (non HMO)

## 2014-05-11 NOTE — Progress Notes (Signed)
Nursing note Pt given discharge instructions, medication list and follow up appointments. Pt verbalized understanding and all questions were answered will discharge home as ordered. Layton Tappan, Randall An RN

## 2014-05-11 NOTE — Discharge Instructions (Signed)
° ° °  Supplemental Discharge Instructions for  Pacemaker/Defibrillator Patients  Activity No heavy lifting or vigorous activity with your left/right arm for 6 to 8 weeks.  Do not raise your left/right arm above your head for one week.  Gradually raise your affected arm as drawn below.           __           05-15-14               05-16-14                     05-17-14                  05-18-14  NO DRIVING for   6 months    WOUND CARE - Keep the wound area clean and dry.  Do not get this area wet for one week. No showers for one week; you may shower on  05-18-14   . - The tape/steri-strips on your wound will fall off; do not pull them off.  No bandage is needed on the site.  DO  NOT apply any creams, oils, or ointments to the wound area. - If you notice any drainage or discharge from the wound, any swelling or bruising at the site, or you develop a fever > 101? F after you are discharged home, call the office at once.  Special Instructions - You are still able to use cellular telephones; use the ear opposite the side where you have your pacemaker/defibrillator.  Avoid carrying your cellular phone near your device. - When traveling through airports, show security personnel your identification card to avoid being screened in the metal detectors.  Ask the security personnel to use the hand wand. - Avoid arc welding equipment, MRI testing (magnetic resonance imaging), TENS units (transcutaneous nerve stimulators).  Call the office for questions about other devices. - Avoid electrical appliances that are in poor condition or are not properly grounded. - Microwave ovens are safe to be near or to operate.  Additional information for defibrillator patients should your device go off: - If your device goes off ONCE and you feel fine afterward, notify the device clinic nurses. - If your device goes off ONCE and you do not feel well afterward, call 911. - If your device goes off TWICE, call 911. - If your  device goes off THREE times in one day, call 911.  DO NOT DRIVE YOURSELF OR A FAMILY MEMBER WITH A DEFIBRILLATOR TO THE HOSPITAL--CALL 911.

## 2014-05-11 NOTE — Discharge Summary (Signed)
ELECTROPHYSIOLOGY PROCEDURE DISCHARGE SUMMARY    Patient ID: Ernest Morgan,  MRN: [id number], DOB/AGE: Jul 04, 1965 49 y.o.  Admit date: 05/08/2014 Discharge date: 05/11/2014  Primary Care Physician: Minda Meo, MD Electrophysiologist: Graciela Husbands  Primary Discharge Diagnosis:  Ventricular tachycardia with previously implanted ICD at EOL s/p ICD gen change and addition of atrial lead this admission  Secondary Discharge Diagnosis:  1.  ARVD   Allergies  Allergen Reactions  . Lidocaine     REACTION: Slow Heartrate     Procedures This Admission:  1.  Upgrade of previously implanted BSX single chamber ICD to a BSX dual chamber ICD on 05/10/14 by Dr Ladona Ridgel.  The patient received a BSX ICD with model number 5076 right atrial lead.  The previously implanted RV lead was used.   DFT's were successful at 14 J. The device was placed sub-pec. There were no immediate post procedure complications. 2.  CXR on 05/11/14 demonstrated no pneumothorax status post device implantation.   Brief HPI/Hospital Course:  Ernest Morgan is a 49 y.o. male with a past medical history of ARVD and ventricular tachycardia.  He originally underwent ICD implantation in 2008.  He has been maintained on amiodarone and sotalol for rhythm control.  Sotalol had previously been unable to be uptitrated due to bradycardia.  The patient's device had recently reached EOL, however, the patient chose not to have generator change done due to financial concerns.  He presented on the day of admission to an outside hospital with hemodynamically unstable VT for which he was cardioverted.  He was transferred to [facility] for further evaluation.  ICD generator change and addition of an atrial lead was recommended to the patient who wished to proceed.  The patient underwent dual chamber ICD upgrade with details as outlined above. He was monitored on telemetry overnight which demonstrated atrial pacing with intrinsic ventricular conduction.  Left  chest was without hematoma or ecchymosis.  The device was interrogated and found to be functioning normally.  CXR was obtained and demonstrated no pneumothorax status post device implantation.  Wound care, arm mobility, and restrictions were reviewed with the patient.  The patient was examined and considered stable for discharge to home.   No ACE-I required due to normal LV function.  Pt is on Sotalol.   Physical Exam: Filed Vitals:   05/10/14 1801 05/10/14 2032 05/11/14 0550 05/11/14 0843  BP: 112/76 113/76 125/84   Pulse: 56 54 58   Temp:  97.8 F (36.6 C) 97.6 F (36.4 C)   TempSrc:  Oral Oral   Resp:  17 16   Height:    5\' 9"  (1.753 m)  Weight:    143 lb 4.8 oz (65 kg)  SpO2:  98% 97%     GEN- The patient is well appearing, alert and oriented x 3 today.   HEENT: normocephalic, atraumatic; sclera clear, conjunctiva pink; hearing intact; oropharynx clear; neck supple, no JVP Lymph- no cervical lymphadenopathy Lungs- Clear to ausculation bilaterally, normal work of breathing.  No wheezes, rales, rhonchi Heart- Regular rate and rhythm, no murmurs, rubs or gallops, PMI not laterally displaced GI- soft, non-tender, non-distended, bowel sounds present, no hepatosplenomegaly Extremities- no clubbing, cyanosis, or edema; DP/PT/radial pulses 2+ bilaterally MS- no significant deformity or atrophy Skin- warm and dry, no rash or lesion, left chest without hematoma/ecchymosis Psych- euthymic mood, full affect Neuro- strength and sensation are intact   Labs:   Lab Results  Component Value Date   WBC 7.0 05/08/2014  HGB 13.2 05/08/2014   HCT 37.2* 05/08/2014   MCV 94.2 05/08/2014   PLT 174 05/08/2014    Recent Labs Lab 05/08/14 1704  NA 137  K 3.8  CL 109  CO2 21  BUN 15  CREATININE 1.02  CALCIUM 8.3*  GLUCOSE 106*    Discharge Medications:    Medication List    TAKE these medications        amiodarone 200 MG tablet  Commonly known as:  PACERONE  Take 1 tablet (200  mg total) by mouth daily.     aspirin 81 MG tablet  Take 81 mg by mouth daily.     multivitamin tablet  Take 1 tablet by mouth daily.     sotalol 80 MG tablet  Commonly known as:  BETAPACE  TAKE HALF TABLET BY MOUTH TWICE DAILY        Disposition:      Discharge Instructions    Diet - low sodium heart healthy    Complete by:  As directed      Discharge instructions    Complete by:  As directed   No driving X6 months     Increase activity slowly    Complete by:  As directed           Follow-up Information    Follow up with Sherryl Manges, MD On 08/20/2014.   Specialty:  Cardiology   Why:  at 11:30AM   Contact information:   [address] [place] [address] [phone]       Follow up with [facility] In 10 days.   Why:  for post hospital visit - office will call to arrange   Contact information:   [address] [place] [address]       Duration of Discharge Encounter: Greater than 30 minutes including physician time.  Signed, Gypsy Balsam, NP 05/11/2014 10:14 AM

## 2014-05-11 NOTE — Progress Notes (Signed)
   ELECTROPHYSIOLOGY ROUNDING NOTE    SUBJECTIVE: The patient is doing well today.  At this time, he denies chest pain, shortness of breath, or any new concerns.  S/p ICD generator change (EOL) and addition of atrial lead 05-10-14.   CURRENT MEDICATIONS: . amiodarone  200 mg Oral Daily  . aspirin EC  81 mg Oral Daily  . chloroprocaine  60 mL Infiltration Once  . heparin  5,000 Units Subcutaneous 3 times per day  . pneumococcal 23 valent vaccine  0.5 mL Intramuscular Tomorrow-1000  . sotalol  40 mg Oral Q12H   . sodium chloride 10 mL/hr at 05/10/14 1120    OBJECTIVE: Telemetry reveals atrial pacing with intrinsic ventricular conduction, PVC's  Physical Exam: Filed Vitals:   05/10/14 1556 05/10/14 1801 05/10/14 2032 05/11/14 0550  BP: 121/83 112/76 113/76 125/84  Pulse: 50 56 54 58  Temp:   97.8 F (36.6 C) 97.6 F (36.4 C)  TempSrc:   Oral Oral  Resp:   17 16  SpO2:   98% 97%    Intake/Output Summary (Last 24 hours) at 05/11/14 0654 Last data filed at 05/10/14 1756  Gross per 24 hour  Intake 536.67 ml  Output      0 ml  Net 536.67 ml    GEN- The patient is well appearing, alert and oriented x 3 today.   HEENT: normocephalic, atraumatic; sclera clear, conjunctiva pink; hearing intact; oropharynx clear; neck supple, no JVP Lymph- no cervical lymphadenopathy Lungs- Clear to ausculation bilaterally, normal work of breathing.  No wheezes, rales, rhonchi Heart- Regular rate and rhythm, no murmurs, rubs or gallops GI- soft, non-tender, non-distended, bowel sounds present Extremities- no clubbing, cyanosis, or edema; DP/PT/radial pulses 2+ bilaterally MS- no significant deformity or atrophy Skin- warm and dry, no rash or lesion, ICD pocket without hematoma/ecchymosis Psych- euthymic mood, full affect Neuro- strength and sensation are intact   LABS: Basic Metabolic Panel:  Recent Labs  11/57/26 1704  NA 137  K 3.8  CL 109  CO2 21  GLUCOSE 106*  BUN 15    CREATININE 1.02  CALCIUM 8.3*  MG 1.9   CBC:  Recent Labs  05/08/14 1704  WBC 7.0  HGB 13.2  HCT 37.2*  MCV 94.2  PLT 174   Cardiac Enzymes:  Recent Labs  05/08/14 1704 05/09/14 0340  TROPONINI 0.27* 0.31*   Thyroid Function Tests:  Recent Labs  05/08/14 1704  TSH 0.948   RADIOLOGY: No results found.  ASSESSMENT AND PLAN:  Principal Problem:   VENTRICULAR TACHYCARDIA Active Problems:   Automatic implantable cardioverter-defibrillator in situ   Arrhythmogenic right ventricular cardiomyopathy   VT (ventricular tachycardia)  1.  VT No recurrence since admit S/p ICD generator change and addition of atrial lead CXR this morning demonstrates stable lead placement, official read pending Device interrogation demonstrates normal DDD ICD function. Continue amiodarone/sotalol No driving X6 months (pt aware)  Will plan to DC home today. Follow up with TOC visit 10-14 days, follow up with Dr Graciela Husbands 3 months.    Gypsy Balsam, NP 05/11/2014 6:54 AM  EP Attending  Patient seen and examined. Agree with above with amendments. Ok for discharge home.  [name].D.

## 2014-05-12 ENCOUNTER — Telehealth: Payer: Self-pay | Admitting: Internal Medicine

## 2014-05-12 ENCOUNTER — Emergency Department (HOSPITAL_COMMUNITY)
Admission: EM | Admit: 2014-05-12 | Discharge: 2014-05-12 | Discharge status: Absent without leave | Payer: Managed Care, Other (non HMO)

## 2014-05-12 ENCOUNTER — Ambulatory Visit (HOSPITAL_COMMUNITY)
Admission: RE | Admit: 2014-05-12 | Discharge: 2014-05-12 | Disposition: A | Discharge status: Home / Self Care | Payer: Managed Care, Other (non HMO) | Source: Ambulatory Visit | Attending: Internal Medicine | Admitting: Internal Medicine

## 2014-05-12 ENCOUNTER — Ambulatory Visit (INDEPENDENT_AMBULATORY_CARE_PROVIDER_SITE_OTHER): Payer: Managed Care, Other (non HMO) | Admitting: *Deleted

## 2014-05-12 DIAGNOSIS — I428 Other cardiomyopathies: Secondary | ICD-10-CM

## 2014-05-12 DIAGNOSIS — J90 Pleural effusion, not elsewhere classified: Secondary | ICD-10-CM | POA: Diagnosis not present

## 2014-05-12 DIAGNOSIS — I472 Ventricular tachycardia: Secondary | ICD-10-CM

## 2014-05-12 DIAGNOSIS — J9811 Atelectasis: Secondary | ICD-10-CM | POA: Diagnosis not present

## 2014-05-12 DIAGNOSIS — I4729 Other ventricular tachycardia: Secondary | ICD-10-CM

## 2014-05-12 DIAGNOSIS — R079 Chest pain, unspecified: Secondary | ICD-10-CM | POA: Diagnosis not present

## 2014-05-12 DIAGNOSIS — Z95810 Presence of automatic (implantable) cardiac defibrillator: Secondary | ICD-10-CM | POA: Insufficient documentation

## 2014-05-12 LAB — MDC_IDC_ENUM_SESS_TYPE_INCLINIC
Battery Remaining Longevity: >7
Brady Statistic RA Percent Paced: 1 % — CL
Brady Statistic RV Percent Paced: 1 % — CL
Date Time Interrogation Session: 20160316040000
HighPow Impedance: 33 Ohm
HighPow Impedance: 45 Ohm
Implantable Pulse Generator Serial Number: 112736
Lead Channel Impedance Value: 453 Ohm
Lead Channel Impedance Value: 817 Ohm
Lead Channel Pacing Threshold Amplitude: 1 V
Lead Channel Pacing Threshold Amplitude: 1.3 V
Lead Channel Pacing Threshold Pulse Width: 0.4 ms
Lead Channel Pacing Threshold Pulse Width: 0.4 ms
Lead Channel Sensing Intrinsic Amplitude: 3.5 mV
Lead Channel Sensing Intrinsic Amplitude: 3.8 mV
Lead Channel Setting Pacing Amplitude: 2.5 V
Lead Channel Setting Pacing Amplitude: 3.5 V
Lead Channel Setting Pacing Pulse Width: 0.4 ms
Lead Channel Setting Sensing Sensitivity: 0.6 mV
Zone Setting Detection Interval: 250 ms
Zone Setting Detection Interval: 333 ms
Zone Setting Detection Interval: 429 ms

## 2014-05-12 NOTE — Telephone Encounter (Signed)
New message       1. Has your device fired? no 2. Is you device beeping? no  3. Are you experiencing draining or swelling at device site? no  4. Are you calling to see if we received your device transmission? no 5. Have you passed out? No Had defib put in Monday----pt is having pain in the area.  Not sure if it is pain from the procedure or heart pain.  He took a pain pill

## 2014-05-12 NOTE — Patient Instructions (Addendum)
Your physician recommends that you continue on your current medications as directed. Please refer to the Current Medication list given to you today.  A chest x-ray takes a picture of the organs and structures inside the chest, including the heart, lungs, and blood vessels. This test can show several things, including, whether the heart is enlarges; whether fluid is building up in the lungs; and whether pacemaker / defibrillator leads are still in place.  We will call you with these results   Keep your wound check appointment on 3/28

## 2014-05-12 NOTE — Progress Notes (Signed)
ICD check in clinic for CP post procedure. Pt c/o sternal pains during inhalation w/radiation to R chest. Pt admits to relief with PRN percocet. ICD interrogation performed. Normal device function. Thresholds and sensing consistent with implant measurements. Impedance trends stable over time. No evidence of any ventricular arrhythmias. No mode switches. Histogram distribution appropriate for patient and level of activity. No changes made this session. Device programmed at appropriate safety margins. Device programmed to optimize intrinsic conduction. Estimated longevity >7 years.  Plan to follow up for a wound check as scheduled. Patient assessed by Dr.Taylor. Repeat cxr ordered.

## 2014-05-12 NOTE — Telephone Encounter (Signed)
Pt aggred to appt today at 1:00 PM.

## 2014-05-12 NOTE — Telephone Encounter (Signed)
  Patient underwent ICD gen change and RA lead placement on 3/14. Patient called on-call pager 2x for chest pain radiating to R shoulder. Not responding to motrin. I called back but call kept going directly to VM. He went to [facility]. CXR ok. No PTX. Cardiac silhouette normal.   He was given percocet for pain control.  I am concerned he may have micro perf. Will send message to Dr. Ladona Ridgel and his nurse to contact patient for device check and possible echo.   Daniel Bensimhon,MD 3:41 AM

## 2014-05-12 NOTE — Telephone Encounter (Signed)
Patient scheduled to see device clinic today. He tells me he is on his way in to our office now. Dr. Ladona Ridgel states not to order echo at this time.

## 2014-05-13 ENCOUNTER — Telehealth: Payer: Self-pay

## 2014-05-13 DIAGNOSIS — R52 Pain, unspecified: Secondary | ICD-10-CM

## 2014-05-13 NOTE — Telephone Encounter (Signed)
-----   Message from Danton Clap sent at 05/13/2014  4:36 PM EDT ----- TOC appt with Ladona Ridgel 05-21-14   Thanks, Melissa

## 2014-05-13 NOTE — Telephone Encounter (Signed)
Left message to call back  

## 2014-05-14 MED ORDER — TRAMADOL HCL 50 MG PO TABS: 50.0000 mg | ORAL_TABLET | Freq: Every evening | ORAL | Status: DC | PRN

## 2014-05-14 NOTE — Telephone Encounter (Signed)
F/U       Pt states he is returning call from today.    Please call back.

## 2014-05-14 NOTE — Telephone Encounter (Signed)
New message     Pt C/O medication issue:  1. Name of Medication: percocet    2. How are you currently taking this medication (dosage and times per day)? S/p surgery ICD   3. Are you having a reaction (difficulty breathing--STAT)? Unable to sleep , itch all night.   4. What is your medication issue? Need an alternative

## 2014-05-14 NOTE — Telephone Encounter (Signed)
Spoke with patient and he had itching last night after taking his Percocet Percocet Rx'd by ED in [place] for pain, recent ICD generator change  Patient stated his pain was doing much better except tenderness/soreness around incision site Discussed with Dr Tenny Craw DOD and will Rx Ultram 50 mg one at bedtime as needed. Try using just Tylenol  When entered Rx possible side effects like with Percocet Discussed with Pharm D Megan and ok to try as long as just itching side effect. Be on look out for itching and if occurs try OTC Benadryl.  Advised patient, verbalized understanding. Patient will try using Tylenol PRN  Patient aware of upcoming appointments

## 2014-05-19 ENCOUNTER — Other Ambulatory Visit: Payer: Self-pay | Admitting: Cardiology

## 2014-05-19 ENCOUNTER — Telehealth: Payer: Self-pay | Admitting: Cardiology

## 2014-05-19 DIAGNOSIS — R52 Pain, unspecified: Secondary | ICD-10-CM

## 2014-05-19 MED ORDER — TRAMADOL HCL 50 MG PO TABS: ORAL_TABLET | ORAL | Status: DC

## 2014-05-19 NOTE — Telephone Encounter (Signed)
Pt called with sternal pain that he has had since ICD placed.  He is out of ultram.  I called in #30 with no refills.  His discomfort is 6/10, th ultram did help, he is to see Dr. Ladona Ridgel on the 25th. He will call if pain increases.

## 2014-05-20 ENCOUNTER — Telehealth: Payer: Self-pay | Admitting: Internal Medicine

## 2014-05-20 NOTE — Telephone Encounter (Signed)
Patient tells me his throbbing pain is similar to pain he was experiencing last week when seen by Dr. Ladona Ridgel.  He tried ibuprofen and a muscle rub without relief. He did take prescribed pain med that did relieve the pain.  Patient states he is currently pain free. Reviewed with Dr. Ladona Ridgel - order for repeat echo and will discuss further at appt tomorrow. Arranged for patient to have echo in [place] tomorrow after appt with Dr. Ladona Ridgel. Patient verbalized understanding and agreeable to plan.

## 2014-05-20 NOTE — Telephone Encounter (Signed)
New Message         Pt calling stating that he is having some pain in his breast bone but says that he wouldn't describe it as chest pain, but wants to speak with someone about it. Please call back and advise.

## 2014-05-21 ENCOUNTER — Ambulatory Visit (HOSPITAL_COMMUNITY): Payer: Managed Care, Other (non HMO) | Attending: Internal Medicine

## 2014-05-21 ENCOUNTER — Ambulatory Visit (INDEPENDENT_AMBULATORY_CARE_PROVIDER_SITE_OTHER): Payer: Managed Care, Other (non HMO) | Admitting: Internal Medicine

## 2014-05-21 ENCOUNTER — Encounter: Payer: Self-pay | Admitting: *Deleted

## 2014-05-21 ENCOUNTER — Encounter: Payer: Self-pay | Admitting: Internal Medicine

## 2014-05-21 VITALS — BP 120/86 | HR 68 | Ht 69.0 in | Wt 142.0 lb

## 2014-05-21 DIAGNOSIS — I472 Ventricular tachycardia, unspecified: Secondary | ICD-10-CM

## 2014-05-21 DIAGNOSIS — Z95810 Presence of automatic (implantable) cardiac defibrillator: Secondary | ICD-10-CM

## 2014-05-21 DIAGNOSIS — I428 Other cardiomyopathies: Secondary | ICD-10-CM | POA: Diagnosis not present

## 2014-05-21 LAB — MDC_IDC_ENUM_SESS_TYPE_INCLINIC
Battery Remaining Longevity: 66 mo
Brady Statistic RA Percent Paced: 1 %
Brady Statistic RV Percent Paced: 1 %
Date Time Interrogation Session: 20160325040000
HighPow Impedance: 33 Ohm
HighPow Impedance: 48 Ohm
Implantable Pulse Generator Serial Number: 112736
Lead Channel Impedance Value: 620 Ohm
Lead Channel Impedance Value: 800 Ohm
Lead Channel Pacing Threshold Amplitude: 1.1 V
Lead Channel Pacing Threshold Amplitude: 1.5 V
Lead Channel Pacing Threshold Pulse Width: 0.4 ms
Lead Channel Pacing Threshold Pulse Width: 0.4 ms
Lead Channel Sensing Intrinsic Amplitude: 4.9 mV
Lead Channel Sensing Intrinsic Amplitude: 5.8 mV
Lead Channel Setting Pacing Amplitude: 2.5 V
Lead Channel Setting Pacing Amplitude: 3.5 V
Lead Channel Setting Pacing Pulse Width: 0.4 ms
Lead Channel Setting Sensing Sensitivity: 0.6 mV
Zone Setting Detection Interval: 250 ms
Zone Setting Detection Interval: 333 ms
Zone Setting Detection Interval: 429 ms

## 2014-05-21 NOTE — Assessment & Plan Note (Signed)
His [employer] ICD is working normally. Will recheck in several months. 

## 2014-05-21 NOTE — Progress Notes (Signed)
HPI Ernest Morgan returns today for ongoing evaluation of chest pressure. He is a pleasant 49 yo man with ARVD, s/p ICD implant who presented to the hospital with recurrent sustained VT (we never saw evidence of this) and found to have an ICD at EOL. He was also noted to have sinus node dysfunction and underwent insertion of a new atrial lead and ICD. His procedure was complicated by hematoma as his pocket was relocated into a subpectoral location. Initially his leads were working normally. His chest pain was thought to be due to bleeding. He returns today for followup. A CXR several weeks ago was unrevealing. He was pending a 2D echo today but notes that his pain has resolved and he overall feels well and would like to return to work.  Allergies  Allergen Reactions  . Lidocaine     REACTION: Slow Heartrate  . Percocet [Oxycodone-Acetaminophen] Itching     Current Outpatient Prescriptions  Medication Sig Dispense Refill  . amiodarone (PACERONE) 200 MG tablet Take 1 tablet (200 mg total) by mouth daily. 30 tablet 5  . aspirin 81 MG tablet Take 81 mg by mouth daily.    . methimazole (TAPAZOLE) 5 MG tablet Take 5 mg by mouth daily.    . Multiple Vitamin (MULTIVITAMIN) tablet Take 1 tablet by mouth daily.    . sotalol (BETAPACE) 80 MG tablet TAKE HALF TABLET BY MOUTH TWICE DAILY  30 tablet 5  . traMADol (ULTRAM) 50 MG tablet May take 1 or two tabs every 12 hours as needed for pain. 30 tablet 0   No current facility-administered medications for this visit.     Past Medical History  Diagnosis Date  . Ventricular tachycardia   . Arrhythmogenic right ventricular cardiomyopathy   . ICD -[employer]     ROS:   All systems reviewed and negative except as noted in the HPI.   Past Surgical History  Procedure Laterality Date  . Cardiac defibrillator placement      Guidant - T175 Vitality  . Implantable cardioverter defibrillator (icd) generator change N/A 05/10/2014    Procedure:  ICD GENERATOR CHANGE;  Surgeon: Marinus Maw, MD;  Location: [facility]ATH LAB;  Service: Cardiovascular;  Laterality: N/A;     Family History  Problem Relation Age of Onset  . Heart disease Mother   . Diabetes Mother   . Diabetes Father      History   Social History  . Marital Status: Married    Spouse Name: N/A  . Number of Children: N/A  . Years of Education: N/A   Occupational History  . Not on file.   Social History Main Topics  . Smoking status: Current Every Day Smoker -- 1.00 packs/day for 10 years    Types: Cigarettes  . Smokeless tobacco: Never Used  . Alcohol Use: No  . Drug Use: No  . Sexual Activity: Yes   Other Topics Concern  . Not on file   Social History Narrative     BP 120/86 mmHg  Pulse 68  Ht 5\' 9"  (1.753 m)  Wt 142 lb (64.411 kg)  BMI 20.96 kg/m2  SpO2 94%  Physical Exam:  Well appearing middle aged man, NAD HEENT: Unremarkable Neck:  No JVD, no thyromegally Lymphatics:  No adenopathy Back:  No CVA tenderness Lungs:  Clear with no wheezes HEART:  Regular rate rhythm, no murmurs, no rubs, no clicks Abd:  soft, positive bowel sounds, no organomegally, no rebound, no guarding  Ext:  2 plus pulses, no edema, no cyanosis, no clubbing Skin:  No rashes no nodules Neuro:  CN II through XII intact, motor grossly intact   DEVICE  Normal device function.  See PaceArt for details.   Assess/Plan:

## 2014-05-21 NOTE — Patient Instructions (Signed)
Your physician recommends that you schedule a follow-up appointment in: 08/20/14 as scheduled   Your physician recommends that you continue on your current medications as directed. Please refer to the Current Medication list given to you today.  Your Echo has been canceled.  You have been given a letter to return to work.   Thank you for choosing [facility]!

## 2014-05-21 NOTE — Assessment & Plan Note (Signed)
He has had no recurrent episodes. Will follow.

## 2014-05-21 NOTE — Assessment & Plan Note (Signed)
No evidence of disease progression based on density of ectopy. Will follow.

## 2014-05-24 ENCOUNTER — Encounter: Payer: Self-pay | Admitting: *Deleted

## 2014-05-24 ENCOUNTER — Telehealth: Payer: Self-pay | Admitting: Internal Medicine

## 2014-05-24 ENCOUNTER — Ambulatory Visit: Payer: Managed Care, Other (non HMO)

## 2014-05-24 NOTE — Telephone Encounter (Signed)
Letter faxed.

## 2014-05-24 NOTE — Telephone Encounter (Signed)
Patient states he got a letter last week that stated he may return to work. 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 Informed patient I would fax note to requested fax number of [phone]. He is thankful for the help.

## 2014-05-24 NOTE — Telephone Encounter (Signed)
New Message    Patient needs paper work faxed to his job that it is ok for him to be driving .[phone]

## 2014-06-09 ENCOUNTER — Telehealth: Payer: Self-pay | Admitting: Internal Medicine

## 2014-06-09 NOTE — Telephone Encounter (Signed)
New message      Were you able to get the application for financial assistance completed?

## 2014-06-09 NOTE — Telephone Encounter (Signed)
Informed patient that paperwork he completed was sent to hospital. Gave him phone number to contact in hospital for help with this (number given to me by our billing dept) - [phone]. Patient verbalized understanding and thanks me helping.

## 2014-06-28 ENCOUNTER — Encounter: Payer: Self-pay | Admitting: Internal Medicine

## 2014-07-10 ENCOUNTER — Telehealth: Payer: Self-pay | Admitting: Cardiology

## 2014-07-10 NOTE — Telephone Encounter (Signed)
Patient called and stated that he felt dizzy last night.  This am he got dizzy and sweaty but no other symptoms and took his BP and it was 126/26mmHg.  He took it a little later and it was systolic.  He went to Urgent Care in [place] and EMS was called and he was instructed to go to [facility].  He decided not to go and went home.  He still denies any further symptoms but is concerned that the MD at Urgent Care told him that he had T wave inversions on EKG.  The closets hospital is [facility] so I instructed him to go there to be evaluated.  He agrees to go.

## 2014-07-12 ENCOUNTER — Telehealth: Payer: Self-pay | Admitting: Cardiology

## 2014-07-12 ENCOUNTER — Telehealth: Payer: Self-pay | Admitting: Physician Assistant

## 2014-07-12 NOTE — Telephone Encounter (Signed)
LMOVM for pt to return call. Per Dr. Tenny Craw she wants pt seen by device clinic sooner than Wednesday appt.

## 2014-07-12 NOTE — Telephone Encounter (Signed)
Patient walked in. Concerned that he has experienced recent episode of clamminess, abnormal BP readings and just in general "feeling weird". States he did not sleep last night worried about the Urgent Care statement that he had T wave inversion on his EKG. Triage BP today 135/95, HR 57, RRR. Skin warm and dry. No SOB or distress noted. Denies CP. Reviewed by Dr. Elease Hashimoto. Past EKG's consistent with T wave inversion. Nothing acute noted on most recent EKG. Patient advised to drink more water (he drinks a lot of soda) and to schedule appointment as soon as available to get full exam. He has June appt with Dr. Graciela Husbands that is for pacer check. Scheduled patient to see Gypsy Balsam this Wednesday, May 18th at 4:00pm. Patient is agreeable. Advised to call us for any new or worsening sx. Patient agrees with plan of care.

## 2014-07-12 NOTE — Telephone Encounter (Signed)
Pt coming to office at 8AM on 5-17 to have device interrogated. Pt agreed to appt.

## 2014-07-12 NOTE — Telephone Encounter (Signed)
Pt checked his BP on an in-store machine and got an abnormal reading with a high DBP, > 100. Another reading the next day was normal, but a third reading was abnormal. Pt very concerned and went to Urgent Care in [place], [place]. Phone [phone].  He had an ECG there and they told him he had T wave inversions. He was treated and released.  The ECG changes worried him all night and he was unable to sleep. He wants to talk to Dr Ladona Ridgel about this.  Plan: Reviewed ECGs in system and T waves have been inverted in V1-3 in the past.  Victorino Dike said she would call them and try to get them to fax [place] the ECG for comparison.   Reassured the patient that his DBP was frequently 80s-90s and this is normal for him. His SBP has been 120s, also normal.   Advised him that taking his BP at rest on a home machine would give more consistent readings to use for comparison.   I will call him back once we get the ECG or figure out that they won't send it.   If someone tells him his ECG is abnormal in the future, requested he get a copy of it.  Theodore Demark, Cordelia Poche 07/12/2014 7:10 AM Beeper [phone]

## 2014-07-13 ENCOUNTER — Ambulatory Visit (INDEPENDENT_AMBULATORY_CARE_PROVIDER_SITE_OTHER): Payer: Managed Care, Other (non HMO) | Admitting: Internal Medicine

## 2014-07-13 ENCOUNTER — Encounter: Payer: Self-pay | Admitting: Internal Medicine

## 2014-07-13 DIAGNOSIS — I428 Other cardiomyopathies: Secondary | ICD-10-CM | POA: Diagnosis not present

## 2014-07-13 DIAGNOSIS — R079 Chest pain, unspecified: Secondary | ICD-10-CM | POA: Diagnosis not present

## 2014-07-13 DIAGNOSIS — I472 Ventricular tachycardia, unspecified: Secondary | ICD-10-CM

## 2014-07-13 DIAGNOSIS — Z95810 Presence of automatic (implantable) cardiac defibrillator: Secondary | ICD-10-CM | POA: Diagnosis not present

## 2014-07-13 LAB — CUP PACEART INCLINIC DEVICE CHECK
Date Time Interrogation Session: 20160517040000
HighPow Impedance: 33 Ohm
HighPow Impedance: 52 Ohm
Lead Channel Impedance Value: 693 Ohm
Lead Channel Impedance Value: 775 Ohm
Lead Channel Pacing Threshold Amplitude: 1.1 V
Lead Channel Pacing Threshold Amplitude: 1.4 V
Lead Channel Pacing Threshold Pulse Width: 0.4 ms
Lead Channel Pacing Threshold Pulse Width: 0.4 ms
Lead Channel Sensing Intrinsic Amplitude: 3.9 mV
Lead Channel Sensing Intrinsic Amplitude: 4.8 mV
Lead Channel Setting Pacing Amplitude: 2.5 V
Lead Channel Setting Pacing Amplitude: 3.5 V
Lead Channel Setting Pacing Pulse Width: 0.4 ms
Lead Channel Setting Sensing Sensitivity: 0.6 mV
Pulse Gen Serial Number: 112736
Zone Setting Detection Interval: 250 ms
Zone Setting Detection Interval: 333 ms
Zone Setting Detection Interval: 429 ms

## 2014-07-13 MED ORDER — AMIODARONE HCL 200 MG PO TABS: 200.0000 mg | ORAL_TABLET | Freq: Every day | ORAL | Status: DC

## 2014-07-13 NOTE — Progress Notes (Signed)
HPI [name] returns today for ongoing evaluation of VT. He is a pleasant 49 yo man with ARVD, s/p ICD implant who presented to the hospital with recurrent sustained VT (we never saw evidence of this) and found to have an ICD at EOL. He was also noted to have sinus node dysfunction and underwent insertion of a new atrial lead and ICD. His procedure was complicated by hematoma as his pocket was relocated into a subpectoral location. Initially his leads were working normally. His chest pain was thought to be due to bleeding. He returns today for followup. Over the weekend he has had recurrent palpitations but no ICD shock. Allergies  Allergen Reactions  . Lidocaine     REACTION: Slow Heartrate  . Percocet [Oxycodone-Acetaminophen] Itching     Current Outpatient Prescriptions  Medication Sig Dispense Refill  . amiodarone (PACERONE) 200 MG tablet Take 1 tablet (200 mg total) by mouth daily. Take 1.5 Tablets by mouth daily 30 tablet 5  . aspirin 81 MG tablet Take 81 mg by mouth daily.    . methimazole (TAPAZOLE) 5 MG tablet Take 5 mg by mouth daily.    . Multiple Vitamin (MULTIVITAMIN) tablet Take 1 tablet by mouth daily.    . sotalol (BETAPACE) 80 MG tablet TAKE HALF TABLET BY MOUTH TWICE DAILY  30 tablet 5  . traMADol (ULTRAM) 50 MG tablet May take 1 or two tabs every 12 hours as needed for pain. 30 tablet 0   No current facility-administered medications for this visit.     Past Medical History  Diagnosis Date  . Ventricular tachycardia   . Arrhythmogenic right ventricular cardiomyopathy   . ICD -[employer]     ROS:   All systems reviewed and negative except as noted in the HPI.   Past Surgical History  Procedure Laterality Date  . Cardiac defibrillator placement      Guidant - T175 Vitality  . Implantable cardioverter defibrillator (icd) generator change N/A 05/10/2014    Procedure: ICD GENERATOR CHANGE;  Surgeon: Marinus Maw, MD;  Location: [facility] CATH LAB;   Service: Cardiovascular;  Laterality: N/A;     Family History  Problem Relation Age of Onset  . Heart disease Mother   . Diabetes Mother   . Diabetes Father      History   Social History  . Marital Status: Married    Spouse Name: N/A  . Number of Children: N/A  . Years of Education: N/A   Occupational History  . Not on file.   Social History Main Topics  . Smoking status: Current Every Day Smoker -- 1.00 packs/day for 10 years    Types: Cigarettes  . Smokeless tobacco: Never Used  . Alcohol Use: No  . Drug Use: No  . Sexual Activity: Yes   Other Topics Concern  . Not on file   Social History Narrative     BP - 115/72 and P- 72 and R- 16 Physical Exam:  Well appearing middle aged man, NAD HEENT: Unremarkable Neck:  No JVD, no thyromegally Lymphatics:  No adenopathy Back:  No CVA tenderness Lungs:  Clear with no wheezes HEART:  Regular rate rhythm, no murmurs, no rubs, no clicks Abd:  soft, positive bowel sounds, no organomegally, no rebound, no guarding Ext:  2 plus pulses, no edema, no cyanosis, no clubbing Skin:  No rashes no nodules Neuro:  CN II through XII intact, motor grossly intact   DEVICE  Normal device function.  See PaceArt for details.   Assess/Plan:

## 2014-07-13 NOTE — Assessment & Plan Note (Signed)
He will continue his amio at a higher dose.

## 2014-07-13 NOTE — Assessment & Plan Note (Signed)
His Boston ICD is working normally. Will recheck in several months. 

## 2014-07-13 NOTE — Assessment & Plan Note (Signed)
This was present for several weeks after ICD implant but has now resolved and his device is working normally.

## 2014-07-13 NOTE — Assessment & Plan Note (Signed)
He has had recurrent VT with multiple ICD therapies but no shocks. I have reprogrammed his ICD to allow for more spins of ATP and slightly more aggressive. I have asked the patient to increase his amio to 300 mg daily until he sees Dr. Crissie Sickles back in the office in a month.

## 2014-07-14 ENCOUNTER — Other Ambulatory Visit: Payer: Self-pay | Admitting: *Deleted

## 2014-07-14 ENCOUNTER — Telehealth: Payer: Self-pay | Admitting: *Deleted

## 2014-07-14 ENCOUNTER — Ambulatory Visit: Payer: Managed Care, Other (non HMO) | Admitting: Nurse Practitioner

## 2014-07-14 MED ORDER — AMIODARONE HCL 200 MG PO TABS: ORAL_TABLET | ORAL | Status: DC

## 2014-07-14 NOTE — Telephone Encounter (Signed)
called to verify med sig per pharm request, spoke with cathy.

## 2014-07-30 ENCOUNTER — Telehealth: Payer: Self-pay | Admitting: Internal Medicine

## 2014-07-30 ENCOUNTER — Encounter: Payer: Self-pay | Admitting: Internal Medicine

## 2014-07-30 NOTE — Telephone Encounter (Signed)
New message       Pt request Ernest Morgan call him before she leaves.  He would not tell me what he wanted

## 2014-07-30 NOTE — Telephone Encounter (Signed)
Patient was asking questions about last office visit with Dr. Ladona Ridgel and ablation. Reviewed with patient and informed him that we would review this when he sees Dr. Graciela Husbands on 6/24.  We will see how medication increase has affected things and discuss at ov on the 24th. Patient is agreeable.

## 2014-08-09 ENCOUNTER — Telehealth: Payer: Self-pay | Admitting: Internal Medicine

## 2014-08-09 NOTE — Telephone Encounter (Signed)
New message       Pt is really fatigued.  He states he does not feel well------not having any specific symptoms.  Please advise

## 2014-08-09 NOTE — Telephone Encounter (Signed)
Calling stating he "just hasn't felt good" over the past few days.  Yesterday was very fatigued, light headed. Today having some vague jaw pain when he was eating grapes. Denies SOB, Chest pain, tightness in chest.  States BP this AM was 112/72 HR 64-66. States he hasn't received equipment to do transmission for ICD. Had gen change 05/08/14. Is taking Amiodarone 200 mg (1 1/2) tablets daily and Sotalol 80 mg daily. States he is staying hydrated with gator aid and water. Spoke w/Lori Gerhardt,NP/flex who suggests that he see Solon Palm on Wed when Dr. Ladona Ridgel in office. Spoke w/device clinic and they will call to see where the equipment status stands.  He verbalizes understanding and will be here on Wed.

## 2014-08-10 ENCOUNTER — Telehealth: Payer: Self-pay | Admitting: Internal Medicine

## 2014-08-10 NOTE — Telephone Encounter (Signed)
Pt c/o Shortness Of Breath: STAT if SOB developed within the last 24 hours or pt is noticeably SOB on the phone  1. Are you currently SOB (can you hear that pt is SOB on the phone)? No   2. How long have you been experiencing SOB? Started last night. When he woke up this morning it was beating really hard. When he gets in and out of the truck he has SOB  3. Are you SOB when sitting or when up moving around? Moving around   4. Are you currently experiencing any other symptoms? Bp is normal but in concerns him that he gets out of breath very easily

## 2014-08-10 NOTE — Telephone Encounter (Signed)
Patient has appointment in the am with Ward Givens NP. Patient wanted to be seen today if possible. Informed patient that there are no more appointment for today. Informed patient if he feels worse and feels like he needs to see someone right away to go to urgent care or ED in his area. Patient verbalized understanding.

## 2014-08-11 ENCOUNTER — Ambulatory Visit (INDEPENDENT_AMBULATORY_CARE_PROVIDER_SITE_OTHER): Payer: Managed Care, Other (non HMO) | Admitting: *Deleted

## 2014-08-11 ENCOUNTER — Ambulatory Visit (INDEPENDENT_AMBULATORY_CARE_PROVIDER_SITE_OTHER): Payer: Managed Care, Other (non HMO) | Admitting: Nurse Practitioner

## 2014-08-11 ENCOUNTER — Encounter: Payer: Self-pay | Admitting: Nurse Practitioner

## 2014-08-11 ENCOUNTER — Encounter: Payer: Self-pay | Admitting: Internal Medicine

## 2014-08-11 VITALS — BP 110/60 | HR 60 | Ht 69.0 in | Wt 141.0 lb

## 2014-08-11 DIAGNOSIS — I428 Other cardiomyopathies: Secondary | ICD-10-CM

## 2014-08-11 DIAGNOSIS — I472 Ventricular tachycardia, unspecified: Secondary | ICD-10-CM

## 2014-08-11 DIAGNOSIS — I4729 Other ventricular tachycardia: Secondary | ICD-10-CM

## 2014-08-11 LAB — CUP PACEART INCLINIC DEVICE CHECK
Date Time Interrogation Session: 20160615040000
HighPow Impedance: 33 Ohm
HighPow Impedance: 51 Ohm
Lead Channel Impedance Value: 709 Ohm
Lead Channel Impedance Value: 734 Ohm
Lead Channel Pacing Threshold Amplitude: 1.2 V
Lead Channel Pacing Threshold Amplitude: 1.4 V
Lead Channel Pacing Threshold Pulse Width: 0.4 ms
Lead Channel Pacing Threshold Pulse Width: 0.4 ms
Lead Channel Sensing Intrinsic Amplitude: 6 mV
Lead Channel Sensing Intrinsic Amplitude: 6.5 mV
Lead Channel Setting Pacing Amplitude: 2.4 V
Lead Channel Setting Pacing Amplitude: 2.5 V
Lead Channel Setting Pacing Pulse Width: 0.4 ms
Lead Channel Setting Sensing Sensitivity: 0.6 mV
Pulse Gen Serial Number: 112736
Zone Setting Detection Interval: 250 ms
Zone Setting Detection Interval: 333 ms
Zone Setting Detection Interval: 429 ms

## 2014-08-11 NOTE — Progress Notes (Signed)
Patient Name: Ernest Morgan Date of Encounter: 08/11/2014  Primary Care Provider:  Geoffery Lyons, MD Primary Cardiologist:  Olin Pia, MD   Chief Complaint  49 year old male with a history of ARVD and ventricular tachycardia who presents related to recurrent palpitations and lightheadedness.  Past Medical History   Past Medical History  Diagnosis Date  . Ventricular tachycardia   . Arrhythmogenic right ventricular cardiomyopathy     a. 04/2014 s/p ICD upgrade 2/2 EOL-->BSX E143 Engergen DR DC AICD.  Marland Kitchen ICD -[employer]   . Sinus node dysfunction     a. 04/2014 Atrial lead added @ time of ICD upgrade.   Past Surgical History  Procedure Laterality Date  . Cardiac defibrillator placement      Guidant - T175 Vitality  . Implantable cardioverter defibrillator (icd) generator change N/A 05/10/2014    Procedure: ICD GENERATOR CHANGE;  Surgeon: Evans Lance, MD;  Location: [facility] CATH LAB;  Service: Cardiovascular;  Laterality: N/A;    Allergies  Allergies  Allergen Reactions  . Lidocaine     REACTION: Slow Heartrate  . Percocet [Oxycodone-Acetaminophen] Itching    HPI  49 year old male with the above complex problem list. He has a history of ventricular tachycardia in the setting of ARVD and is status post ICD upgrade in March 2016. At that time, he was also noted to have sinus node dysfunction and an atrial lead was added. He was last seen in clinic by Dr. Lovena Le on May 17 an interrogation of his device was notable for multiple episodes of ventricular tachycardia. His ATP was adjusted to allow for more rounds of ATP prior to proceeding to shock. He has been on amiodarone 300 mg daily as well as sotalol 40 mg twice a day. He has done reasonably well since his last visit but yesterday morning awoke with palpitations and mild lightheadedness and dyspnea. He checked his blood pressure and it was 338 systolic with a heart rate of 60. He felt fine the remainder of the morning and  went on to work. Early yesterday evening, around 6:00, he developed lightheadedness which he says lasted about 3 or 4 minutes. He also felt profoundly weak during that episode. Symptoms eventually resolved spontaneously. This morning, he had recurrent palpitations and mild dyspnea and was added to my clinic schedule. Currently denies chest pain, PND, orthopnea, syncope, edema, or early satiety.  Home Medications  Prior to Admission medications   Medication Sig Start Date End Date Taking? Authorizing Provider  amiodarone (PACERONE) 200 MG tablet TAKE 1 & 1/2 TABLET BY MOUTH DAILY. 07/14/14  Yes Evans Lance, MD  aspirin 81 MG tablet Take 81 mg by mouth daily.   Yes Historical Provider, MD  methimazole (TAPAZOLE) 5 MG tablet Take 5 mg by mouth daily.   Yes Historical Provider, MD  Multiple Vitamin (MULTIVITAMIN) tablet Take 1 tablet by mouth daily.   Yes Historical Provider, MD  sotalol (BETAPACE) 80 MG tablet TAKE HALF TABLET BY MOUTH TWICE DAILY    Yes Deboraha Sprang, MD  traMADol Veatrice Bourbon) 50 MG tablet May take 1 or two tabs every 12 hours as needed for pain. 05/19/14  Yes Isaiah Serge, NP    Review of Systems  Palpitations with lightheadedness and extreme fatigue occurring yesterday.  All other systems reviewed and are otherwise negative except as noted above.  Physical Exam  VS:  BP 110/60 mmHg  Pulse 60  Ht $R'5\' 9"'eF$  (1.753 m)  Wt 141 lb (63.957 kg)  BMI  20.81 kg/m2 , BMI Body mass index is 20.81 kg/(m^2). GEN: Well nourished, well developed, in no acute distress. HEENT: normal. Neck: Supple, no JVD, carotid bruits, or masses. Cardiac: RRR, no murmurs, rubs, or gallops. No clubbing, cyanosis, edema.  Radials/DP/PT 2+ and equal bilaterally.  Respiratory:  Respirations regular and unlabored, clear to auscultation bilaterally. GI: Soft, nontender, nondistended, BS + x 4. MS: no deformity or atrophy. Skin: warm and dry, no rash. Neuro:  Strength and sensation are intact. Psych: Normal  affect.  Accessory Clinical Findings  ECG - regular sinus rhythm, 60, deep T-wave inversion in 23 and aVF as well as V3. These changes are old. No acute changes.  AICD interrogation reveals multiple episodes of ventricular tachycardia beginning yesterday morning at 4:35 AM with resultant ATP on 30 occasions between 4:35 yesterday morning and 5:36 this morning.  Assessment & Plan  1.  VT/ARVD: Case discussed and ICD interrogation reviewed with Dr. Lovena Le who also met with the patient independently.  Dr. Lovena Le discussed the indications for ventricular tachycardia ablation in this setting. Further titration of either amiodarone or sotalol potentially lower his VT rate below the detection zone for VT resulting in persistent VT with potentially significant symptoms. Thus adjustment of anti-rhythmic therapy was not felt to be a viable option. Risks and benefits of ventricular tachycardia ablation were discussed and at this time, patient would like to think about it and plans to follow-up with Dr. Caryl Comes as scheduled on June 24 to discuss further. He was advised that in the setting of multiple episodes of symptomatic ventricular tachycardia requiring device therapy, that he should not be driving.  2. Disposition: Follow-up with Dr. Caryl Comes as scheduled.   Murray Hodgkins, NP 08/11/2014, 9:05 AM

## 2014-08-11 NOTE — Progress Notes (Signed)
Pt seeing Ernest Morgan due to frequent ATP since yesterday. Interrogation shows 97 ATP since 07/13/14. Changed RA output from 3.5 to 2.4V. VT ablation discussion revisted w/ GT. Pt prefers to keep appt w/ SK 08/20/14.

## 2014-08-11 NOTE — Patient Instructions (Signed)
Medication Instructions:  Your physician recommends that you continue on your current medications as directed. Please refer to the Current Medication list given to you today.  Labwork: NONE  Testing/Procedures: NONE  Follow-Up: Your physician recommends that you keep your follow-up appointment with Dr. Graciela Husbands on 08/20/14.   Any Other Special Instructions Will Be Listed Below (If Applicable).

## 2014-08-12 ENCOUNTER — Other Ambulatory Visit: Payer: Self-pay | Admitting: Internal Medicine

## 2014-08-20 ENCOUNTER — Ambulatory Visit (INDEPENDENT_AMBULATORY_CARE_PROVIDER_SITE_OTHER): Payer: Managed Care, Other (non HMO) | Admitting: Internal Medicine

## 2014-08-20 ENCOUNTER — Encounter: Payer: Self-pay | Admitting: Internal Medicine

## 2014-08-20 VITALS — BP 122/78 | HR 48 | Ht 69.0 in | Wt 142.6 lb

## 2014-08-20 DIAGNOSIS — I472 Ventricular tachycardia, unspecified: Secondary | ICD-10-CM

## 2014-08-20 DIAGNOSIS — Z4502 Encounter for adjustment and management of automatic implantable cardiac defibrillator: Secondary | ICD-10-CM | POA: Diagnosis not present

## 2014-08-20 LAB — CUP PACEART INCLINIC DEVICE CHECK
Date Time Interrogation Session: 20160624170001
Lead Channel Setting Pacing Amplitude: 2.4 V
Lead Channel Setting Pacing Amplitude: 2.5 V
Lead Channel Setting Pacing Pulse Width: 0.4 ms
Lead Channel Setting Sensing Sensitivity: 0.6 mV
Pulse Gen Serial Number: 112736
Zone Setting Detection Interval: 250 ms
Zone Setting Detection Interval: 333 ms
Zone Setting Detection Interval: 429 ms

## 2014-08-20 MED ORDER — SOTALOL HCL 80 MG PO TABS: 80.0000 mg | ORAL_TABLET | Freq: Two times a day (BID) | ORAL | Status: DC

## 2014-08-20 NOTE — Patient Instructions (Signed)
Medication Instructions:  Your physician has recommended you make the following change in your medication:  1) INCREASE Sotalol to 80 mg twice daily  Labwork: None ordered  Testing/Procedures: None ordered  Follow-Up: Your physician recommends that you schedule a follow-up appointment in: 2 weeks with device clinic.  Your physician recommends that you schedule a follow-up appointment in: 3 months with Dr. Graciela Husbands  Thank you for choosing [facility]!!

## 2014-08-20 NOTE — Progress Notes (Signed)
      Patient Care Team: Geoffry Paradise, MD as PCP - General (Internal Medicine)   HPI  Ernest Morgan is a 49 y.o. 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 He is doing combination of amiodarone and sotalol for some time now. Genetic testing was discussed at his last visit but was not consummated.  He denies intercurrent ventricular tachycardia.  He is taking his medications;  he underwent device generator replacement a few months ago. He received an atrial lead at that time because of sinus node dysfunction.  Past Medical History  Diagnosis Date  . Ventricular tachycardia   . Arrhythmogenic right ventricular cardiomyopathy     a. 04/2014 s/p ICD upgrade 2/2 EOL-->BSX E143 Engergen DR DC AICD.  Marland Kitchen ICD -[employer]   . Sinus node dysfunction     a. 04/2014 Atrial lead added @ time of ICD upgrade.    Past Surgical History  Procedure Laterality Date  . Cardiac defibrillator placement      Guidant - T175 Vitality  . Implantable cardioverter defibrillator (icd) generator change N/A 05/10/2014    Procedure: ICD GENERATOR CHANGE;  Surgeon: Marinus Maw, MD;  Location: [facility] CATH LAB;  Service: Cardiovascular;  Laterality: N/A;    Current Outpatient Prescriptions  Medication Sig Dispense Refill  . amiodarone (PACERONE) 200 MG tablet TAKE 1 & 1/2 TABLET BY MOUTH DAILY. 45 tablet 5  . aspirin 81 MG tablet Take 81 mg by mouth daily.    . methimazole (TAPAZOLE) 5 MG tablet Take 5 mg by mouth daily.    . Multiple Vitamin (MULTIVITAMIN) tablet Take 1 tablet by mouth daily.    . sotalol (BETAPACE) 80 MG tablet TAKE ONE-HALF TABLET BY MOUTH TWICE DAILY 30 tablet 0   No current facility-administered medications for this visit.    Allergies  Allergen Reactions  . Lidocaine     REACTION: Slow Heartrate  . Percocet [Oxycodone-Acetaminophen] Itching    Review of Systems negative except from HPI and PMH  Physical Exam BP 122/78 mmHg   Pulse 48  Ht 5\' 9"  (1.753 m)  Wt 142 lb 9.6 oz (64.683 kg)  BMI 21.05 kg/m2 Well developed and well nourished in no acute distress HENT normal E scleral and icterus clear Neck Supple JVP flat; carotids brisk and full Clear to ausculation  Regular rate and rhythm, no murmurs gallops or rub Soft with active bowel sounds No clubbing cyanosis no Edema Alert and oriented, grossly normal motor and sensory function Skin Warm and Dry  NSR with PVCs LBBB Inferior axis with Twave inversion s V1--V4  Assessment and  Plan  Arrhythmogenic RV cardiomyopathy  Ventricular tachycardia with history of storm  Implantable defibrillator    He has had a flurry of ventricular tachycardia. There have Been multiple episodes of ATP some episodes of VT requiring multiple bursts of ATP.  We will increase his sotalol from 40--80 twice a day and reassess in about 2 weeks. In the event that this is not settled things down, we will increase it further to 120 twice a day. Last renal function 3/16 was reasonable with a creatinine of 1.0.  I will be in touch with Dr. Macon Large at [facility] concerning possible catheter ablation as with ARVC, we would want to have accessed expertise of both endocardial and epicardial ablative targets

## 2014-09-09 ENCOUNTER — Ambulatory Visit (INDEPENDENT_AMBULATORY_CARE_PROVIDER_SITE_OTHER): Payer: Managed Care, Other (non HMO) | Admitting: *Deleted

## 2014-09-09 DIAGNOSIS — I472 Ventricular tachycardia, unspecified: Secondary | ICD-10-CM

## 2014-09-09 DIAGNOSIS — I428 Other cardiomyopathies: Secondary | ICD-10-CM

## 2014-09-09 LAB — CUP PACEART INCLINIC DEVICE CHECK
Battery Remaining Longevity: 10.5
Brady Statistic RA Percent Paced: 13 %
Brady Statistic RV Percent Paced: 1 % — CL
Date Time Interrogation Session: 20160714040000
HighPow Impedance: 33 Ohm
Lead Channel Impedance Value: 731 Ohm
Lead Channel Impedance Value: 820 Ohm
Lead Channel Sensing Intrinsic Amplitude: 5.9 mV
Lead Channel Sensing Intrinsic Amplitude: 6.8 mV
Lead Channel Setting Pacing Amplitude: 2.4 V
Lead Channel Setting Pacing Amplitude: 2.5 V
Lead Channel Setting Pacing Pulse Width: 0.4 ms
Lead Channel Setting Sensing Sensitivity: 0.6 mV
Pulse Gen Serial Number: 112736
Zone Setting Detection Interval: 250 ms
Zone Setting Detection Interval: 333 ms
Zone Setting Detection Interval: 429 ms

## 2014-09-09 NOTE — Progress Notes (Signed)
ICD check in clinic for new VT/VF episodes only(N/C). No new VT/VF episodes recorded since 6/24 Sotalol increase. Patient to follow up with SK on 9/20 @ 1600 as scheduled.

## 2014-10-05 ENCOUNTER — Encounter: Payer: Self-pay | Admitting: Internal Medicine

## 2014-10-11 ENCOUNTER — Other Ambulatory Visit: Payer: Self-pay | Admitting: *Deleted

## 2014-10-11 MED ORDER — SOTALOL HCL 80 MG PO TABS: 80.0000 mg | ORAL_TABLET | Freq: Two times a day (BID) | ORAL | Status: DC

## 2014-10-11 MED ORDER — AMIODARONE HCL 200 MG PO TABS: ORAL_TABLET | ORAL | Status: DC

## 2014-11-16 ENCOUNTER — Encounter: Payer: Managed Care, Other (non HMO) | Admitting: Internal Medicine

## 2014-11-29 ENCOUNTER — Ambulatory Visit (INDEPENDENT_AMBULATORY_CARE_PROVIDER_SITE_OTHER): Payer: Managed Care, Other (non HMO) | Admitting: Internal Medicine

## 2014-11-29 ENCOUNTER — Encounter: Payer: Self-pay | Admitting: Internal Medicine

## 2014-11-29 VITALS — BP 122/90 | HR 55 | Ht 69.0 in | Wt 145.4 lb

## 2014-11-29 DIAGNOSIS — I472 Ventricular tachycardia, unspecified: Secondary | ICD-10-CM

## 2014-11-29 DIAGNOSIS — Z79899 Other long term (current) drug therapy: Secondary | ICD-10-CM

## 2014-11-29 DIAGNOSIS — Z4502 Encounter for adjustment and management of automatic implantable cardiac defibrillator: Secondary | ICD-10-CM

## 2014-11-29 LAB — CUP PACEART INCLINIC DEVICE CHECK
Date Time Interrogation Session: 20161003040000
HighPow Impedance: 33 Ohm
HighPow Impedance: 52 Ohm
Lead Channel Impedance Value: 736 Ohm
Lead Channel Impedance Value: 801 Ohm
Lead Channel Pacing Threshold Amplitude: 1.2 V
Lead Channel Pacing Threshold Amplitude: 1.5 V
Lead Channel Pacing Threshold Pulse Width: 0.4 ms
Lead Channel Pacing Threshold Pulse Width: 0.4 ms
Lead Channel Sensing Intrinsic Amplitude: 4.9 mV
Lead Channel Sensing Intrinsic Amplitude: 5.8 mV
Lead Channel Setting Pacing Amplitude: 2.4 V
Lead Channel Setting Pacing Amplitude: 2.5 V
Lead Channel Setting Pacing Pulse Width: 0.4 ms
Lead Channel Setting Sensing Sensitivity: 0.6 mV
Pulse Gen Serial Number: 112736
Zone Setting Detection Interval: 250 ms
Zone Setting Detection Interval: 333 ms
Zone Setting Detection Interval: 429 ms

## 2014-11-29 NOTE — Progress Notes (Signed)
      Patient Care Team: Geoffry Paradise, MD as PCP - General (Internal Medicine)   HPI  Ernest Morgan is a 49 y.o. 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 He is doing combination of amiodarone and sotalol for some time now. Genetic testing was discussed at his last visit but was not consummated.  He denies intercurrent ventricular tachycardia.  He is taking his medications;  he underwent device generator replacement a few months ago. He received an atrial lead at that time because of sinus node dysfunction.  Device interrogation 6/15 demonstrated significant interval increase in ventricular tachycardia   sotalol dose was increased at that time and repeat interrogation 7/16 and demonstrated no further ventricular tachycardia. We also discussed at that time referral for consideration of catheter ablation  Past Medical History  Diagnosis Date  . Ventricular tachycardia (HCC)   . Arrhythmogenic right ventricular cardiomyopathy (HCC)     a. 04/2014 s/p ICD upgrade 2/2 EOL-->BSX E143 Engergen DR DC AICD.  Marland Kitchen ICD -[employer]   . Sinus node dysfunction (HCC)     a. 04/2014 Atrial lead added @ time of ICD upgrade.    Past Surgical History  Procedure Laterality Date  . Cardiac defibrillator placement      Guidant - T175 Vitality  . Implantable cardioverter defibrillator (icd) generator change N/A 05/10/2014    Procedure: ICD GENERATOR CHANGE;  Surgeon: Marinus Maw, MD;  Location: [facility] CATH LAB;  Service: Cardiovascular;  Laterality: N/A;    Current Outpatient Prescriptions  Medication Sig Dispense Refill  . amiodarone (PACERONE) 200 MG tablet TAKE 1 & 1/2 TABLET BY MOUTH DAILY. 135 tablet 0  . aspirin 81 MG tablet Take 81 mg by mouth daily.    . methimazole (TAPAZOLE) 5 MG tablet Take 5 mg by mouth daily.    . Multiple Vitamin (MULTIVITAMIN) tablet Take 1 tablet by mouth daily.    . sotalol (BETAPACE) 80 MG tablet Take 1 tablet (80 mg  total) by mouth 2 (two) times daily. 180 tablet 0   No current facility-administered medications for this visit.    Allergies  Allergen Reactions  . Lidocaine     REACTION: Slow Heartrate  . Percocet [Oxycodone-Acetaminophen] Itching    Review of Systems negative except from HPI and PMH  Physical Exam BP 122/90 mmHg  Pulse 55  Ht 5\' 9"  (1.753 m)  Wt 145 lb 6.4 oz (65.953 kg)  BMI 21.46 kg/m2 Well developed and well nourished in no acute distress HENT normal E scleral and icterus clear Neck Supple JVP flat; carotids brisk and full Clear to ausculation  Regular rate and rhythm, no murmurs gallops or rub Soft with active bowel sounds No clubbing cyanosis no Edema Alert and oriented, grossly normal motor and sensory function Skin Warm and Dry  NSR  at 55 Intervals 19/11/54 T-wave inversions V1-V4  Assessment and  Plan  Arrhythmogenic RV cardiomyopathy  Ventricular tachycardia with history of storm  Implantable defibrillator  [employer]    He's had no intercurrent ventricular tachycardia on the higher dose of sotalol.  We again discussed catheter ablation. At this juncture he would like to hold off on seeing Dr. Macon Large at [facility].  We will check his surveillance laboratories.  We discussed issues related to his daughters social situation

## 2014-11-29 NOTE — Patient Instructions (Addendum)
Medication Instructions:  Your physician recommends that you continue on your current medications as directed. Please refer to the Current Medication list given to you today.  Labwork: We will contact Dr. Lanell Matar office about recent lab work.  We will call you if we need to arrange medication management labs.  Testing/Procedures: None ordered  Follow-Up: Remote monitoring is used to monitor your Pacemaker of ICD from home. This monitoring reduces the number of office visits required to check your device to one time per year. It allows [place] to keep an eye on the functioning of your device to ensure it is working properly. You are scheduled for a device check from home on 03/01/2015. You may send your transmission at any time that day. If you have a wireless device, the transmission will be sent automatically. After your physician reviews your transmission, you will receive a postcard with your next transmission date.  Your physician wants you to follow-up in: 6 months with Gypsy Balsam, NP.   You will receive a reminder letter in the mail two months in advance. If you don't receive a letter, please call our office to schedule the follow-up appointment.   Any Other Special Instructions Will Be Listed Below (If Applicable). Thank you for choosing [facility]!!

## 2014-12-02 ENCOUNTER — Encounter: Payer: Self-pay | Admitting: Internal Medicine

## 2014-12-10 ENCOUNTER — Other Ambulatory Visit: Payer: Managed Care, Other (non HMO)

## 2014-12-10 ENCOUNTER — Telehealth: Payer: Self-pay | Admitting: *Deleted

## 2014-12-10 NOTE — Telephone Encounter (Signed)
Patient calls me back explaining that he cannot come this afternoon. He will call back to reschedule the lab work.

## 2014-12-10 NOTE — Telephone Encounter (Signed)
Called patient informing him that Dr. Lanell Matar office only had TSH lab and they faxed that to [place]. Explained that we still needed at CMET & Magnesium level. He is agreeable to stop by this afternoon to have drawn.

## 2014-12-14 ENCOUNTER — Encounter: Payer: Self-pay | Admitting: Internal Medicine

## 2014-12-23 ENCOUNTER — Telehealth: Payer: Self-pay | Admitting: Cardiology

## 2014-12-23 NOTE — Telephone Encounter (Signed)
Pt called with complaints of chest pain.  Lt chest pain, only if hit bump 2/10 rated pain - now at home and no pain.  With pain no nausea or SOB or diaphoresis.  Instructed to go to ER if pain returns and lasts for more than a minute.  We will have him come in for EKG tomorrow.

## 2014-12-24 NOTE — Telephone Encounter (Signed)
Called patient about his chest pain. Patient stated he feels fine now, and he is not available to come in today for an EKG. Patient stated he could come in on Monday for EKG. Will schedule EKG for Monday, and patient will call Monday morning to let me know if he still needs to come in. Patient wants to see how he does over the weekend. Advised patient to go to ED if he has more chest pain. Patient verbalized understanding.

## 2014-12-24 NOTE — Telephone Encounter (Signed)
Spoke w/ pt and asked if he was still going to send remote transmission. Pt stated that he was but it would be closer to 6 PM. Informed pt that we will not be in the office at that time but we will receive it when we come in on Monday morning. Pt verbalized understanding.

## 2014-12-24 NOTE — Telephone Encounter (Signed)
Patient has a device, so he is going to send a remote transmission as soon as he can. Will send device clinic a message, so they are aware.

## 2014-12-28 NOTE — Telephone Encounter (Signed)
Reviewed remote transmission, no episodes or alerts.  Device function within normal limits.

## 2014-12-28 NOTE — Telephone Encounter (Signed)
Patient denies any chest pain or SOB. Patient states he feels great. Informed patient that his results from remote transmission. There was no episodes or alerts. Device function within normal limits. Patient verbalized understanding. Encouraged patient to call the office if he as another episode. Will forward to Nada Boozer NP for further advisement.
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
## 2015-01-09 ENCOUNTER — Other Ambulatory Visit: Payer: Self-pay | Admitting: Internal Medicine

## 2015-01-30 ENCOUNTER — Other Ambulatory Visit: Payer: Self-pay | Admitting: Internal Medicine

## 2015-03-01 ENCOUNTER — Telehealth: Payer: Self-pay | Admitting: Cardiology

## 2015-03-01 ENCOUNTER — Ambulatory Visit (INDEPENDENT_AMBULATORY_CARE_PROVIDER_SITE_OTHER): Payer: Managed Care, Other (non HMO) | Admitting: *Deleted

## 2015-03-01 DIAGNOSIS — I472 Ventricular tachycardia, unspecified: Secondary | ICD-10-CM

## 2015-03-01 NOTE — Telephone Encounter (Signed)
Spoke with pt and reminded pt of remote transmission that is due today. Pt verbalized understanding.   

## 2015-03-03 NOTE — Progress Notes (Signed)
Remote ICD transmission.   

## 2015-03-11 LAB — CUP PACEART REMOTE DEVICE CHECK
Battery Remaining Longevity: 126 mo
Battery Remaining Percentage: 100 %
Brady Statistic RA Percent Paced: 11 %
Brady Statistic RV Percent Paced: 0 %
Date Time Interrogation Session: 20170104162000
HighPow Impedance: 49 Ohm
Implantable Lead Implant Date: 20011011
Implantable Lead Implant Date: 20160314
Implantable Lead Location: 753859
Implantable Lead Location: 753860
Implantable Lead Model: 148
Implantable Lead Model: 5076
Implantable Lead Serial Number: 111569
Lead Channel Impedance Value: 681 Ohm
Lead Channel Impedance Value: 815 Ohm
Lead Channel Pacing Threshold Amplitude: 1.2 V
Lead Channel Pacing Threshold Amplitude: 1.5 V
Lead Channel Pacing Threshold Pulse Width: 0.4 ms
Lead Channel Pacing Threshold Pulse Width: 0.4 ms
Lead Channel Setting Pacing Amplitude: 2.4 V
Lead Channel Setting Pacing Amplitude: 2.5 V
Lead Channel Setting Pacing Pulse Width: 0.4 ms
Lead Channel Setting Sensing Sensitivity: 0.6 mV
Pulse Gen Serial Number: 112736

## 2015-03-16 ENCOUNTER — Encounter: Payer: Self-pay | Admitting: Cardiology

## 2015-05-03 ENCOUNTER — Encounter: Payer: Self-pay | Admitting: Nurse Practitioner

## 2015-06-01 ENCOUNTER — Encounter: Payer: Managed Care, Other (non HMO) | Admitting: Nurse Practitioner

## 2015-06-29 ENCOUNTER — Other Ambulatory Visit: Payer: Self-pay | Admitting: Internal Medicine

## 2015-07-26 ENCOUNTER — Encounter: Payer: Managed Care, Other (non HMO) | Admitting: Physician Assistant

## 2015-10-10 ENCOUNTER — Other Ambulatory Visit: Payer: Self-pay | Admitting: Internal Medicine

## 2015-10-11 ENCOUNTER — Other Ambulatory Visit: Payer: Self-pay | Admitting: Internal Medicine

## 2016-01-08 ENCOUNTER — Other Ambulatory Visit: Payer: Self-pay | Admitting: Internal Medicine

## 2016-02-10 ENCOUNTER — Other Ambulatory Visit: Payer: Self-pay | Admitting: Internal Medicine

## 2016-02-15 IMAGING — CR DG CHEST 2V
2 series · 2 of 2 positions shown · non-contrast
Comparison: Chest x-ray 08/05/2009

CLINICAL DATA: 49-year-old male with a history of ICD placement.

EXAM:
CHEST - 2 VIEW

[chest pa]
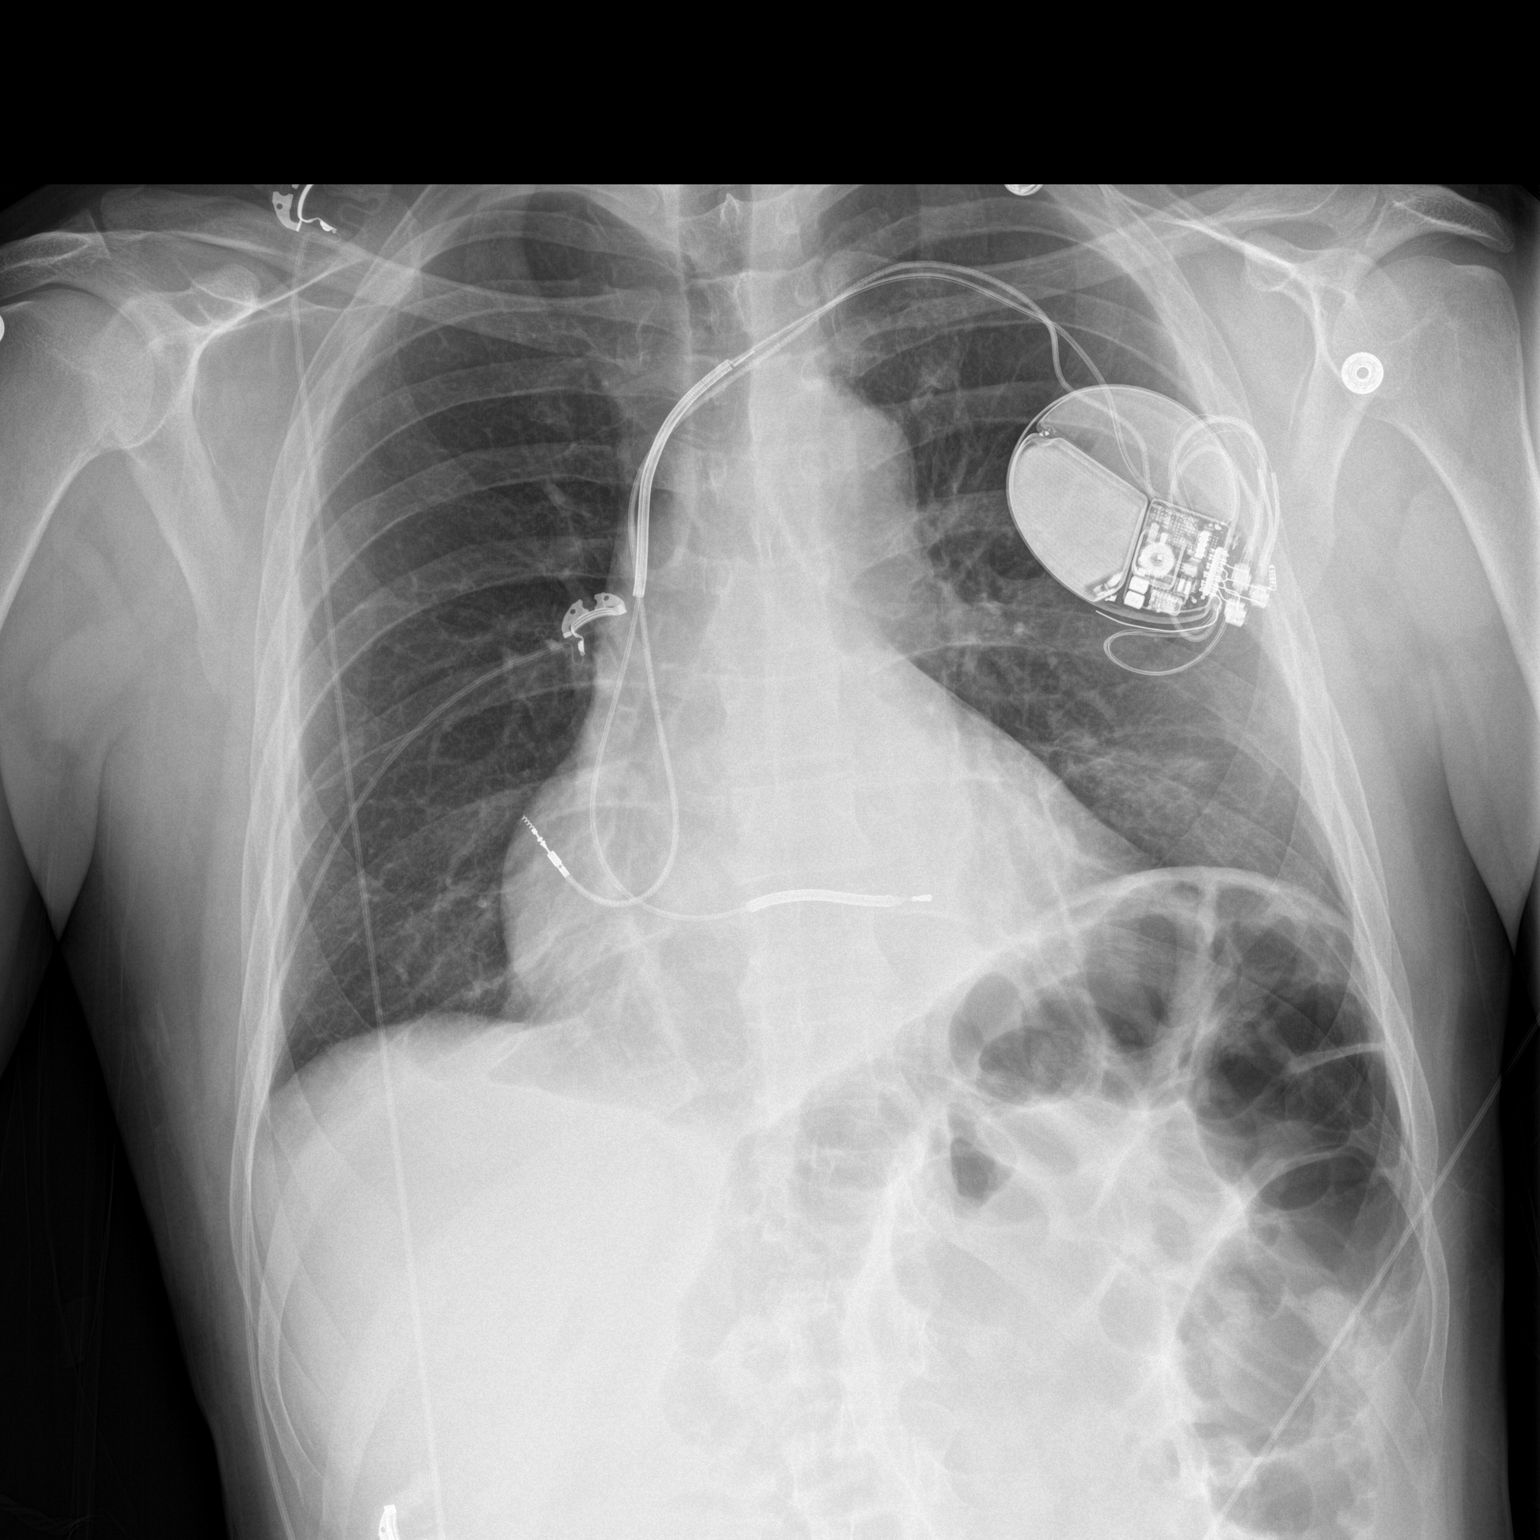

[chest lat]
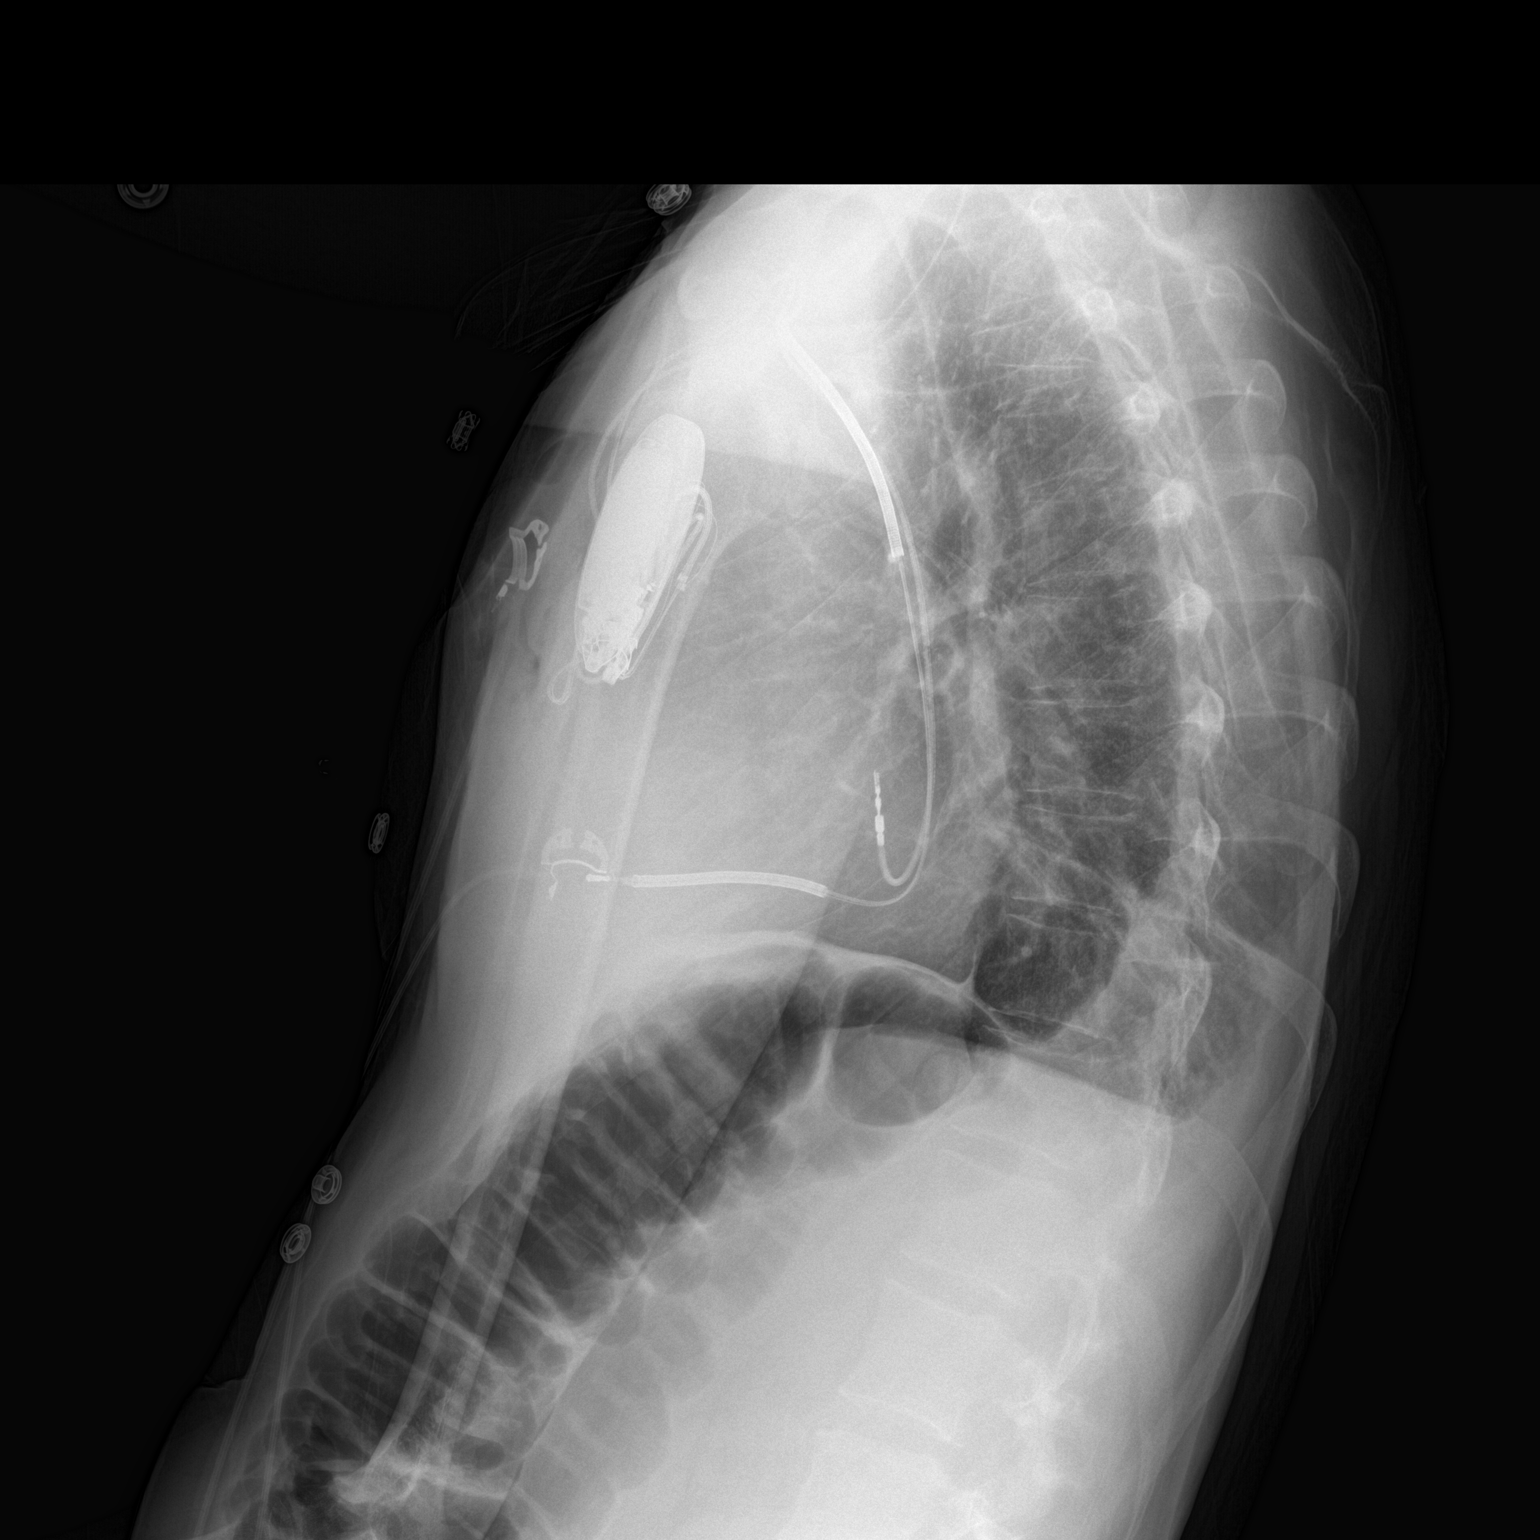

[2 of 2 positions shown; findings below may reference images not displayed]

FINDINGS: Cardiomediastinal silhouette projects slightly larger cardiac
diameter compared to the prior plain film, potentially projectional.

No confluent airspace disease, or pneumothorax. Interstitial
opacities of the left mid lung.

Questionable small pleural effusions on the lateral view, though
there is rotation of the patient.

No evidence of pulmonary vascular congestion.

Interval replacement of the AICD on the left chest wall, with
addition of a right atrial lead.

No displaced fracture. Unremarkable appearance thoracic vertebral
bodies.

Gas within colon.
IMPRESSION: No pneumothorax or lobar pneumonia. Interstitial opacities within
the left mid lung may represent atelectasis given the surgical
history.

Interval replacement of AICD on the left chest wall with addition of
right atrial lead. Unchanged position of right ventricular lead.

Lateral view demonstrates questionable small pleural effusions,
though there is significant rotation of the patient on the lateral
view. If there was further concern for evaluation, a repeat lateral
view may be useful.

## 2016-02-16 IMAGING — DX DG CHEST 2V
2 series · 2 of 2 positions shown · non-contrast
Comparison: 05/11/2014

CLINICAL DATA: Right to mid chest pain. Post AICD generator change
05/09/2013

EXAM:
CHEST  2 VIEW

[chest pa]
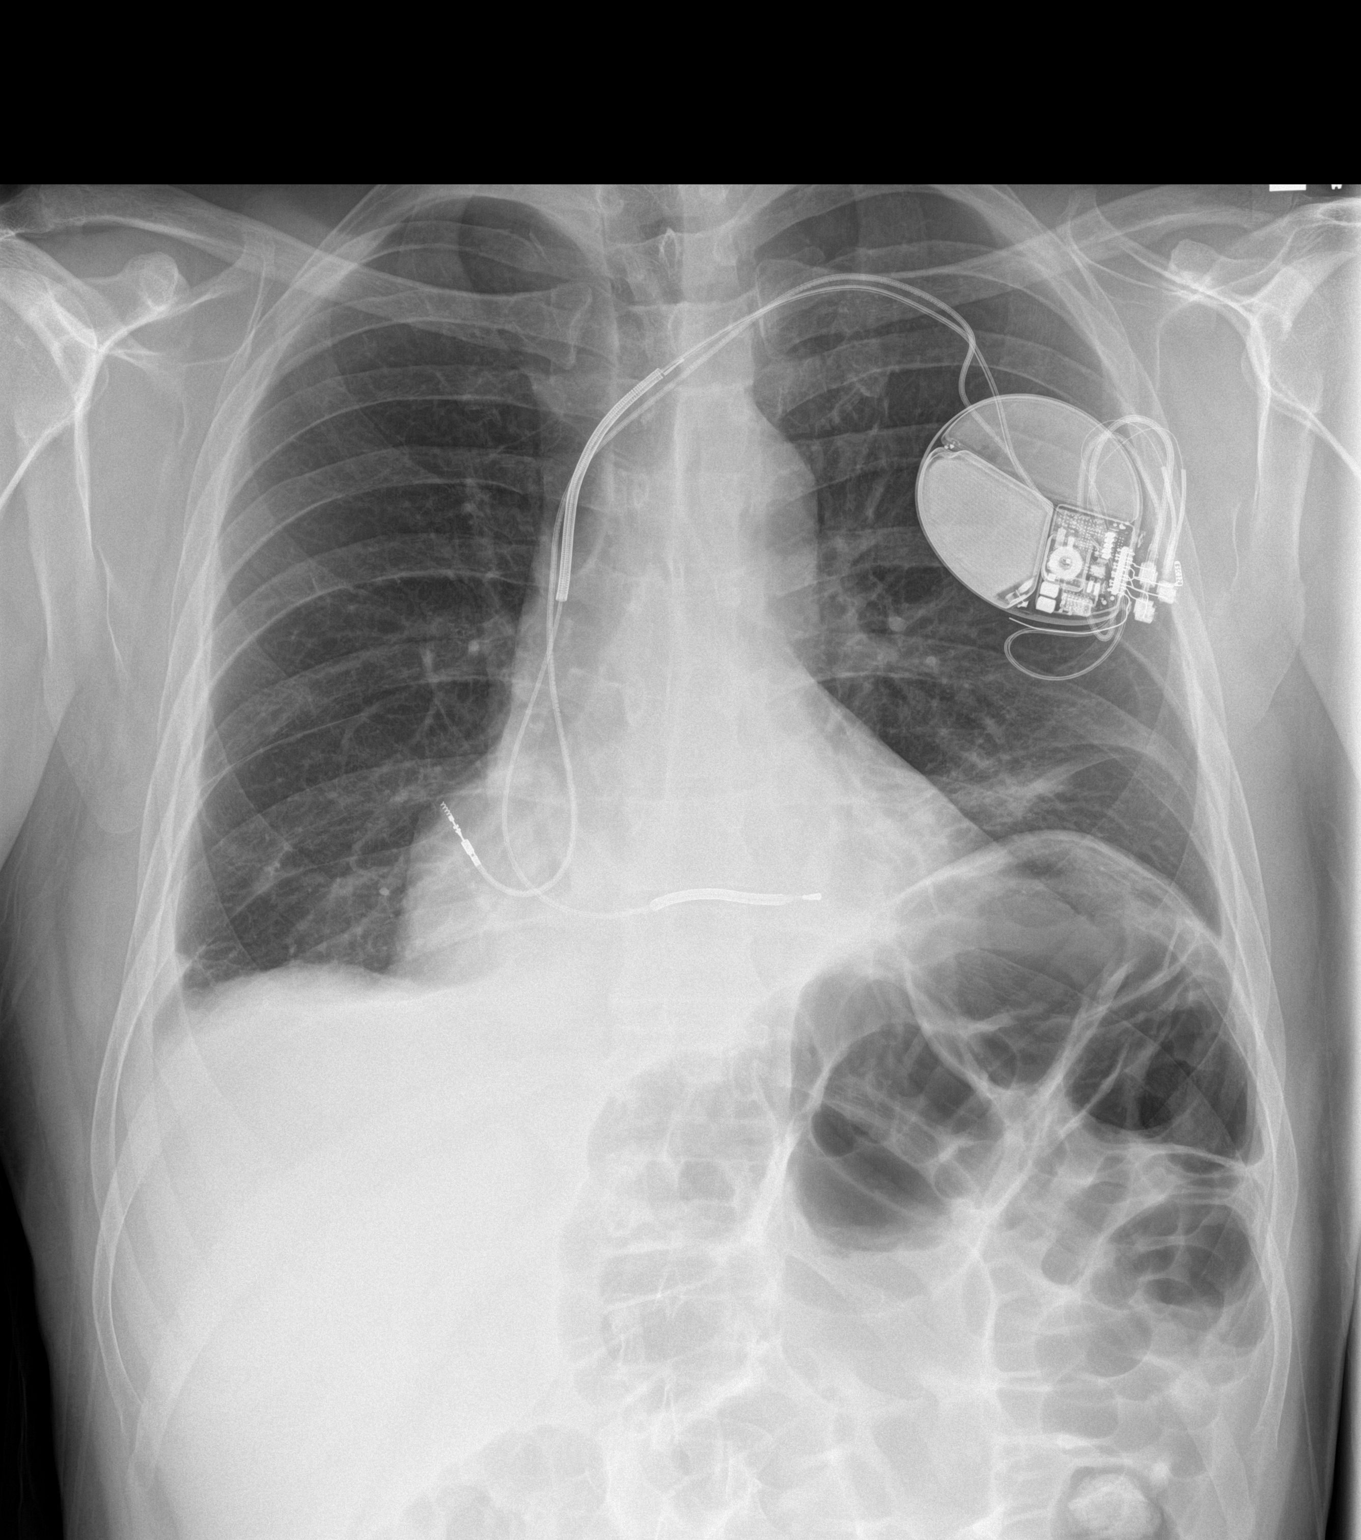

[chest lat]
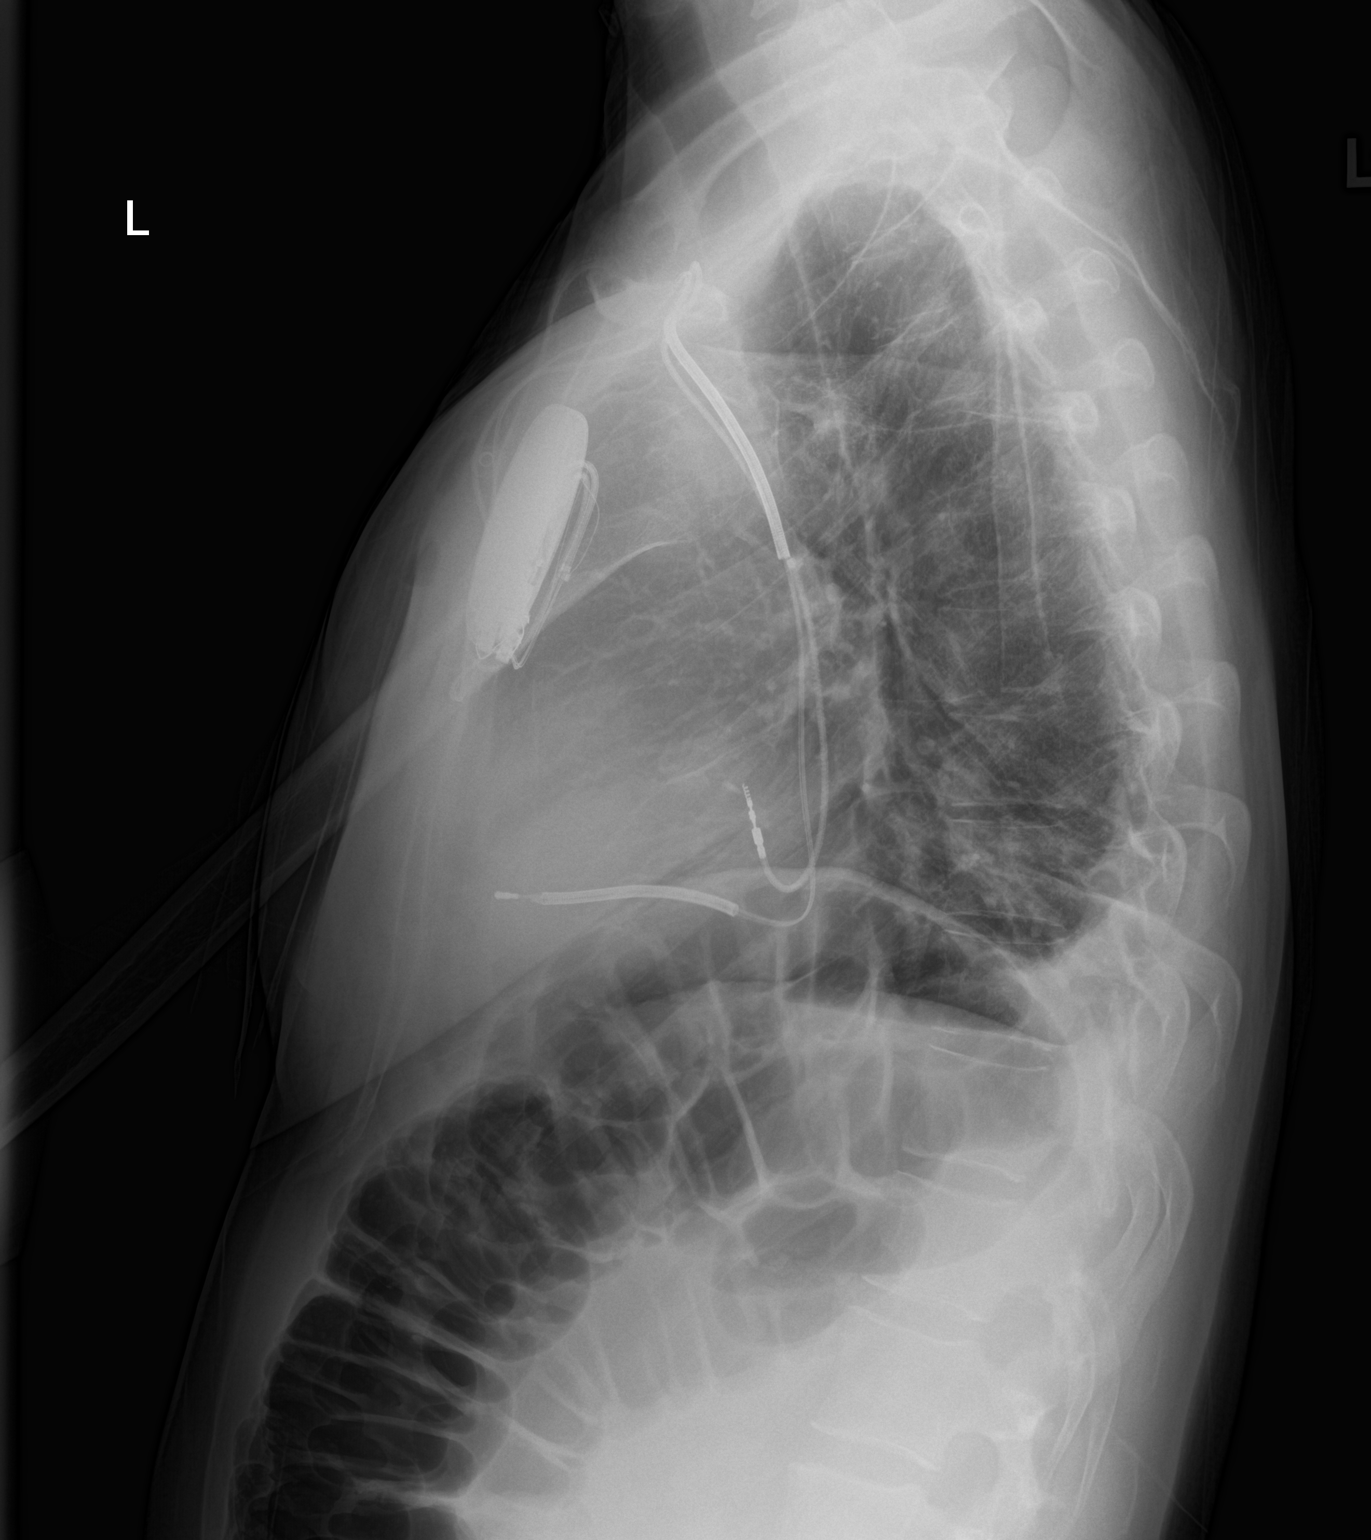

[2 of 2 positions shown; findings below may reference images not displayed]

FINDINGS: Left AICD remains in place with stable appearance. Increasing
bibasilar linear densities compatible with atelectasis. Small
bilateral pleural effusions, similar to prior study. Cardiomegaly is
stable. No acute bony abnormality. No pneumothorax.
IMPRESSION: Left AICD as a stable appearance since yesterday's study. Increasing
bibasilar atelectasis. Stable small effusions.

## 2016-03-05 ENCOUNTER — Ambulatory Visit (INDEPENDENT_AMBULATORY_CARE_PROVIDER_SITE_OTHER): Payer: Self-pay | Admitting: *Deleted

## 2016-03-05 DIAGNOSIS — I472 Ventricular tachycardia: Secondary | ICD-10-CM

## 2016-03-05 DIAGNOSIS — I4729 Other ventricular tachycardia: Secondary | ICD-10-CM

## 2016-03-06 ENCOUNTER — Other Ambulatory Visit: Payer: Self-pay | Admitting: Internal Medicine

## 2016-03-06 NOTE — Progress Notes (Signed)
Remote ICD transmission.   

## 2016-03-07 ENCOUNTER — Encounter: Payer: Self-pay | Admitting: Cardiology

## 2016-03-07 ENCOUNTER — Encounter: Payer: Self-pay | Admitting: Internal Medicine

## 2016-03-07 NOTE — Telephone Encounter (Signed)
Rx refill sent to pharmacy. 

## 2016-03-10 ENCOUNTER — Other Ambulatory Visit: Payer: Self-pay | Admitting: Internal Medicine

## 2016-03-12 NOTE — Telephone Encounter (Signed)
Medication Detail    Disp Refills Start End   sotalol (BETAPACE) 80 MG tablet 30 tablet 0 03/07/2016    Sig: TAKE 1 TABLET BY MOUTH TWICE A DAY   Notes to Pharmacy: Please schedule appointment for refills.   E-Prescribing Status: Receipt confirmed by pharmacy (03/07/2016 4:04 PM EST)   Pharmacy   [id number] - [place], [place] - [address]

## 2016-03-20 ENCOUNTER — Telehealth: Payer: Self-pay | Admitting: Internal Medicine

## 2016-03-20 NOTE — Telephone Encounter (Signed)
New Message     Please call he is requesting to talk to sherry , regarding stress he under from his mother passing away

## 2016-03-21 ENCOUNTER — Other Ambulatory Visit: Payer: Self-pay | Admitting: Internal Medicine

## 2016-03-22 NOTE — Telephone Encounter (Signed)
I called and spoke with the patient.  He was questioning whether each time he transmitted did this apply to his deductible.  I advised him I am unsure of the answer to this question- this would be for device clinic/ billing. He is scheduled to come in on 03/27/16 for see Renee, [place]. He states his mother passed away last week and he is trying to get things settled with her estate- he will try to keep his appt. I advised we hope to see him for follow up.

## 2016-03-22 NOTE — Telephone Encounter (Signed)
Ernest Morgan,   The message said this patient was asking to speak with you, I absolutely don't mind calling him, but wanted to see how well you remembered him.  Thanks!

## 2016-03-22 NOTE — Telephone Encounter (Signed)
Heather,  I don't mind calling him if you need me to.  However, probably best if you do being Ernest Morgan's patient.  I bet he remembers me as primary nurse, but he hasn't been seen since 11/2014.  I am sure he probably wants to speak to nurse and doesn't know that I am not primary anymore. If you call and he still would be more comfortable speaking with me I will be glad to call him.  Thanks

## 2016-03-26 ENCOUNTER — Ambulatory Visit: Payer: Self-pay | Admitting: Physician Assistant

## 2016-03-26 ENCOUNTER — Telehealth: Payer: Self-pay | Admitting: Internal Medicine

## 2016-03-26 NOTE — Telephone Encounter (Signed)
I called and spoke with the patient. He states that he is under a lot of financial constraints at this time.  He was hospitalized recently in [place], [place] for bronchitis and has a $500 bill from the hospital. He does not feel that he can afford his co-pays now. He is scheduled to see Luster Landsberg, PA on 1/30, but wants to cancel this appointment.  I advised I will cancel per his request, but will need to review his follow up with Dr. Graciela Husbands as we have not seen him since 11/2014-  he has an ICD and is on amiodarone & sotalol. I advised I will have to call him back.  He voices understanding.

## 2016-03-26 NOTE — Telephone Encounter (Signed)
Ernest Morgan is requesting a call back, states that he has an appt 03-27-16, doesn't know if he can make it. Would like to speak with you, thanks.

## 2016-03-27 ENCOUNTER — Ambulatory Visit (INDEPENDENT_AMBULATORY_CARE_PROVIDER_SITE_OTHER): Payer: BLUE CROSS/BLUE SHIELD | Admitting: Physician Assistant

## 2016-03-27 VITALS — BP 122/84 | HR 54 | Ht 69.0 in | Wt 135.0 lb

## 2016-03-27 DIAGNOSIS — Z5181 Encounter for therapeutic drug level monitoring: Secondary | ICD-10-CM

## 2016-03-27 DIAGNOSIS — I472 Ventricular tachycardia, unspecified: Secondary | ICD-10-CM

## 2016-03-27 DIAGNOSIS — I428 Other cardiomyopathies: Secondary | ICD-10-CM | POA: Diagnosis not present

## 2016-03-27 LAB — CUP PACEART REMOTE DEVICE CHECK
Battery Remaining Longevity: 126 mo
Battery Remaining Percentage: 100 %
Brady Statistic RA Percent Paced: 17 %
Brady Statistic RV Percent Paced: 0 %
Date Time Interrogation Session: 20180107195100
HighPow Impedance: 47 Ohm
Implantable Lead Implant Date: 20011011
Implantable Lead Implant Date: 20160314
Implantable Lead Location: 753859
Implantable Lead Location: 753860
Implantable Lead Model: 148
Implantable Lead Model: 5076
Implantable Lead Serial Number: 111569
Implantable Pulse Generator Implant Date: 20160314
Lead Channel Impedance Value: 636 Ohm
Lead Channel Impedance Value: 800 Ohm
Lead Channel Pacing Threshold Amplitude: 1.2 V
Lead Channel Pacing Threshold Amplitude: 1.5 V
Lead Channel Pacing Threshold Pulse Width: 0.4 ms
Lead Channel Pacing Threshold Pulse Width: 0.4 ms
Lead Channel Setting Pacing Amplitude: 2.4 V
Lead Channel Setting Pacing Amplitude: 2.5 V
Lead Channel Setting Pacing Pulse Width: 0.4 ms
Lead Channel Setting Sensing Sensitivity: 0.6 mV
Pulse Gen Serial Number: 112736

## 2016-03-27 NOTE — Progress Notes (Signed)
Cardiology Office Note Date:  03/27/2016  Patient ID:  Ernest, Morgan August 17, 1965, MRN [id number] PCP:  Minda Meo, MD  Cardiologist:  Dr. Graciela Husbands   Chief Complaint: overdue visit  History of Present Illness: Ernest Morgan is a 51 y.o. male with history of ARV dysplasia, VT  w/ICD on amiodarone and Sotaol, sinus node dysfunction comes to the office today to be seen for Dr. Graciela Husbands.  He did a remote transmission 03/04/16 with normal findings and no VT since last.  He is feeling well, occasional a vague sense of lightheadedness, not episodic, no near syncope or syncope, no CP, palpitations.  He has not been shocked that he is aware of.   Device information BSCi dual chamber ICD, original implant single lead 2001, A lead and gen change 05/10/14, Dr. Graciela Husbands, VT/ARV dysplasia AAD+ Amiodarone AND Sotalol   Past Medical History:  Diagnosis Date  . Arrhythmogenic right ventricular cardiomyopathy (HCC)    a. 04/2014 s/p ICD upgrade 2/2 EOL-->BSX E143 Engergen DR DC AICD.  Marland Kitchen ICD -[employer]   . Sinus node dysfunction (HCC)    a. 04/2014 Atrial lead added @ time of ICD upgrade.  . Ventricular tachycardia [facility])     Past Surgical History:  Procedure Laterality Date  . CARDIAC DEFIBRILLATOR PLACEMENT     Guidant - T175 Vitality  . IMPLANTABLE CARDIOVERTER DEFIBRILLATOR (ICD) GENERATOR CHANGE N/A 05/10/2014   Procedure: ICD GENERATOR CHANGE;  Surgeon: Marinus Maw, MD;  Location: [facility] CATH LAB;  Service: Cardiovascular;  Laterality: N/A;    Current Outpatient Prescriptions  Medication Sig Dispense Refill  . amiodarone (PACERONE) 200 MG tablet TAKE 1 AND 1/2 TABLET BY MOUTH DAILY 135 tablet 1  . aspirin 81 MG tablet Take 81 mg by mouth daily.    . methimazole (TAPAZOLE) 5 MG tablet Take 5 mg by mouth daily.    . Multiple Vitamin (MULTIVITAMIN) tablet Take 1 tablet by mouth daily.    . sotalol (BETAPACE) 80 MG tablet Take 1 tablet (80 mg total) by mouth 2 (two) times daily. *Patient  needs to keep upcoming appointment for further refills* 60 tablet 0   No current facility-administered medications for this visit.     Allergies:   Lidocaine and Percocet [oxycodone-acetaminophen]   Social History:  The patient  reports that he has never smoked. He has never used smokeless tobacco. He reports that he does not drink alcohol or use drugs.   Family History:  The patient's family history includes Diabetes in his father and mother; Heart disease in his mother.  ROS:  Please see the history of present illness.    All other systems are reviewed and otherwise negative.   PHYSICAL EXAM:  VS:  BP 122/84   Pulse (!) 54   Ht 5\' 9"  (1.753 m)   Wt 135 lb (61.2 kg)   BMI 19.94 kg/m  BMI: Body mass index is 19.94 kg/m. Well nourished, well developed, in no acute distress  HEENT: normocephalic, atraumatic  Neck: no JVD, carotid bruits or masses Cardiac:  RRR; no significant murmurs, no rubs, or gallops Lungs:  CTA b/l, no wheezing, rhonchi or rales  Abd: soft, nontender MS: no deformity or atrophy Ext: no edema  Skin: warm and dry, no rash Neuro:  No gross deficits appreciated Psych: euthymic mood, full affect  ICD site is stable, no tethering or discomfort   EKG:  Done today shows SB, 54bpm, reviewed with dr. Graciela Husbands, QT , QTc , PR , QRS ICD  remote interrogation 03/05/16: Normal findings (reviewed by Dr. Graciela Husbands, no need to repeat at today's visit)  Recent Labs: No results found for requested labs within last 8760 hours.  No results found for requested labs within last 8760 hours.   CrCl cannot be calculated (Patient's most recent lab result is older than the maximum 21 days allowed.).   Wt Readings from Last 3 Encounters:  03/27/16 135 lb (61.2 kg)  11/29/14 145 lb 6.4 oz (66 kg)  08/20/14 142 lb 9.6 oz (64.7 kg)     Other studies reviewed: Additional studies/records reviewed today include: summarized above  ASSESSMENT AND PLAN:  1. ARVD, VT  w/ICD     Remotes reviewed with Dr. Graciela Husbands, he has had some VT in the last year, will continue the same regime     surveillance labs today  Continue Q 3 month remote transmission, in-clinic in 1 year, sooner if needed.  Disposition: as above   Current medicines are reviewed at length with the patient today.  The patient did not have any concerns regarding medicines.  Judith Blonder, PA-C 03/27/2016 4:18 PM     [facility] [address] [place] [address] [phone] (office)  [phone] (fax)

## 2016-03-27 NOTE — Patient Instructions (Signed)
Medication Instructions:   Your physician recommends that you continue on your current medications as directed. Please refer to the Current Medication list given to you today.   If you need a refill on your cardiac medications before your next appointment, please call your pharmacy.  Labwork: BMET TSH AND MAG TODAY    Testing/Procedures: NONE ORDERED  TODAY    Follow-Up: Your physician wants you to follow-up in: ONE YEAR WITH Graciela Husbands   You will receive a reminder letter in the mail two months in advance. If you don't receive a letter, please call our office to schedule the follow-up appointment.  Remote monitoring is used to monitor your Pacemaker of ICD from home. This monitoring reduces the number of office visits required to check your device to one time per year. It allows [place] to keep an eye on the functioning of your device to ensure it is working properly. You are scheduled for a device check from home on .  06/26/2016..You may send your transmission at any time that day. If you have a wireless device, the transmission will be sent automatically. After your physician reviews your transmission, you will receive a postcard with your next transmission date.     Any Other Special Instructions Will Be Listed Below (If Applicable).

## 2016-03-27 NOTE — Telephone Encounter (Signed)
Late entry: Dr. Graciela Husbands spoke with the patient yesterday and advised that the patient come in on 1/30.  He was seen by Francis Dowse, PA today.

## 2016-03-28 LAB — COMPREHENSIVE METABOLIC PANEL
ALT: 11 IU/L (ref 0–44)
AST: 11 IU/L (ref 0–40)
Albumin/Globulin Ratio: 2.6 — ABNORMAL HIGH (ref 1.2–2.2)
Albumin: 4.7 g/dL (ref 3.5–5.5)
Alkaline Phosphatase: 105 IU/L (ref 39–117)
BUN/Creatinine Ratio: 12 (ref 9–20)
BUN: 14 mg/dL (ref 6–24)
Bilirubin Total: 0.6 mg/dL (ref 0.0–1.2)
CO2: 24 mmol/L (ref 18–29)
Calcium: 9.6 mg/dL (ref 8.7–10.2)
Chloride: 100 mmol/L (ref 96–106)
Creatinine, Ser: 1.14 mg/dL (ref 0.76–1.27)
GFR calc Af Amer: 84 mL/min/{1.73_m2} (ref >59)
GFR calc non Af Amer: 73 mL/min/{1.73_m2} (ref >59)
Globulin, Total: 1.8 g/dL (ref 1.5–4.5)
Glucose: 117 mg/dL — ABNORMAL HIGH (ref 65–99)
Potassium: 4.1 mmol/L (ref 3.5–5.2)
Sodium: 140 mmol/L (ref 134–144)
Total Protein: 6.5 g/dL (ref 6.0–8.5)

## 2016-03-28 LAB — TSH: TSH: 0.897 u[IU]/mL (ref 0.450–4.500)

## 2016-03-28 LAB — MAGNESIUM: Magnesium: 2.1 mg/dL (ref 1.6–2.3)

## 2016-03-29 ENCOUNTER — Other Ambulatory Visit: Payer: Self-pay | Admitting: *Deleted

## 2016-03-29 MED ORDER — SOTALOL HCL 80 MG PO TABS: 80.0000 mg | ORAL_TABLET | Freq: Two times a day (BID) | ORAL | 3 refills | Status: DC

## 2016-03-29 MED ORDER — AMIODARONE HCL 200 MG PO TABS: 300.0000 mg | ORAL_TABLET | Freq: Every day | ORAL | 3 refills | Status: DC

## 2016-06-26 ENCOUNTER — Ambulatory Visit (INDEPENDENT_AMBULATORY_CARE_PROVIDER_SITE_OTHER): Payer: BLUE CROSS/BLUE SHIELD | Admitting: *Deleted

## 2016-06-26 ENCOUNTER — Telehealth: Payer: Self-pay | Admitting: Cardiology

## 2016-06-26 DIAGNOSIS — I472 Ventricular tachycardia: Secondary | ICD-10-CM | POA: Diagnosis not present

## 2016-06-26 DIAGNOSIS — I4729 Other ventricular tachycardia: Secondary | ICD-10-CM

## 2016-06-26 NOTE — Telephone Encounter (Signed)
Spoke with pt and reminded pt of remote transmission that is due today. Pt verbalized understanding.   

## 2016-06-27 ENCOUNTER — Encounter: Payer: Self-pay | Admitting: Cardiology

## 2016-06-27 LAB — CUP PACEART REMOTE DEVICE CHECK
Battery Remaining Longevity: 126 mo
Battery Remaining Percentage: 100 %
Brady Statistic RA Percent Paced: 19 %
Brady Statistic RV Percent Paced: 0 %
Date Time Interrogation Session: 20180502103900
HighPow Impedance: 48 Ohm
Implantable Lead Implant Date: 20011011
Implantable Lead Implant Date: 20160314
Implantable Lead Location: 753859
Implantable Lead Location: 753860
Implantable Lead Model: 148
Implantable Lead Model: 5076
Implantable Lead Serial Number: 111569
Implantable Pulse Generator Implant Date: 20160314
Lead Channel Impedance Value: 660 Ohm
Lead Channel Impedance Value: 790 Ohm
Lead Channel Pacing Threshold Amplitude: 1.2 V
Lead Channel Pacing Threshold Amplitude: 1.5 V
Lead Channel Pacing Threshold Pulse Width: 0.4 ms
Lead Channel Pacing Threshold Pulse Width: 0.4 ms
Lead Channel Setting Pacing Amplitude: 2.4 V
Lead Channel Setting Pacing Amplitude: 2.5 V
Lead Channel Setting Pacing Pulse Width: 0.4 ms
Lead Channel Setting Sensing Sensitivity: 0.6 mV
Pulse Gen Serial Number: 112736

## 2016-06-27 NOTE — Progress Notes (Signed)
Remote ICD transmission.   

## 2016-09-25 ENCOUNTER — Encounter: Payer: BLUE CROSS/BLUE SHIELD | Admitting: *Deleted

## 2016-09-25 ENCOUNTER — Telehealth: Payer: Self-pay | Admitting: Cardiology

## 2016-09-25 NOTE — Telephone Encounter (Signed)
Spoke with pt and reminded pt of remote transmission that is due today. Pt verbalized understanding.   

## 2016-09-28 ENCOUNTER — Encounter: Payer: Self-pay | Admitting: Cardiology

## 2016-10-01 ENCOUNTER — Telehealth: Payer: Self-pay | Admitting: Internal Medicine

## 2016-10-01 NOTE — Telephone Encounter (Signed)
New message     1. Has your device fired? no  2. Is you device beeping? No, not working  3. Are you experiencing draining or swelling at device site? no  4. Are you calling to see if we received your device transmission? No pt is letting us know that he has a device on back order that will allow him to transmit and it will be two weeks  5. Have you passed out? no

## 2016-10-02 NOTE — Telephone Encounter (Signed)
To Device Clinic.  

## 2016-10-03 NOTE — Telephone Encounter (Signed)
Remote transmission rescheduled for 10/22/16.

## 2016-10-22 ENCOUNTER — Ambulatory Visit (INDEPENDENT_AMBULATORY_CARE_PROVIDER_SITE_OTHER): Payer: BLUE CROSS/BLUE SHIELD | Admitting: *Deleted

## 2016-10-22 DIAGNOSIS — I4729 Other ventricular tachycardia: Secondary | ICD-10-CM

## 2016-10-22 DIAGNOSIS — I472 Ventricular tachycardia: Secondary | ICD-10-CM | POA: Diagnosis not present

## 2016-10-23 NOTE — Progress Notes (Signed)
Remote ICD transmission.   

## 2016-10-24 LAB — CUP PACEART REMOTE DEVICE CHECK
Battery Remaining Longevity: 120 mo
Battery Remaining Percentage: 100 %
Brady Statistic RA Percent Paced: 21 %
Brady Statistic RV Percent Paced: 0 %
Date Time Interrogation Session: 20180827221700
HighPow Impedance: 47 Ohm
Implantable Lead Implant Date: 20011011
Implantable Lead Implant Date: 20160314
Implantable Lead Location: 753859
Implantable Lead Location: 753860
Implantable Lead Model: 148
Implantable Lead Model: 5076
Implantable Lead Serial Number: 111569
Implantable Pulse Generator Implant Date: 20160314
Lead Channel Impedance Value: 644 Ohm
Lead Channel Impedance Value: 833 Ohm
Lead Channel Pacing Threshold Amplitude: 1.2 V
Lead Channel Pacing Threshold Amplitude: 1.5 V
Lead Channel Pacing Threshold Pulse Width: 0.4 ms
Lead Channel Pacing Threshold Pulse Width: 0.4 ms
Lead Channel Setting Pacing Amplitude: 2.4 V
Lead Channel Setting Pacing Amplitude: 2.5 V
Lead Channel Setting Pacing Pulse Width: 0.4 ms
Lead Channel Setting Sensing Sensitivity: 0.6 mV
Pulse Gen Serial Number: 112736

## 2016-11-02 ENCOUNTER — Encounter: Payer: Self-pay | Admitting: Cardiology

## 2016-11-02 NOTE — Progress Notes (Signed)
Letter  

## 2017-01-21 ENCOUNTER — Encounter: Payer: BLUE CROSS/BLUE SHIELD | Admitting: Cardiology

## 2017-03-18 ENCOUNTER — Ambulatory Visit (INDEPENDENT_AMBULATORY_CARE_PROVIDER_SITE_OTHER): Payer: BLUE CROSS/BLUE SHIELD | Admitting: *Deleted

## 2017-03-18 DIAGNOSIS — I472 Ventricular tachycardia: Secondary | ICD-10-CM

## 2017-03-18 DIAGNOSIS — I4729 Other ventricular tachycardia: Secondary | ICD-10-CM

## 2017-03-18 NOTE — Progress Notes (Signed)
Remote ICD transmission.   

## 2017-03-19 ENCOUNTER — Encounter: Payer: Self-pay | Admitting: Cardiology

## 2017-03-22 LAB — CUP PACEART REMOTE DEVICE CHECK
Battery Remaining Longevity: 114 mo
Battery Remaining Percentage: 100 %
Brady Statistic RA Percent Paced: 22 %
Brady Statistic RV Percent Paced: 0 %
Date Time Interrogation Session: 20190119012400
HighPow Impedance: 52 Ohm
Implantable Lead Implant Date: 20011011
Implantable Lead Implant Date: 20160314
Implantable Lead Location: 753859
Implantable Lead Location: 753860
Implantable Lead Model: 148
Implantable Lead Model: 5076
Implantable Lead Serial Number: 111569
Implantable Pulse Generator Implant Date: 20160314
Lead Channel Impedance Value: 732 Ohm
Lead Channel Impedance Value: 843 Ohm
Lead Channel Pacing Threshold Amplitude: 1.2 V
Lead Channel Pacing Threshold Amplitude: 1.5 V
Lead Channel Pacing Threshold Pulse Width: 0.4 ms
Lead Channel Pacing Threshold Pulse Width: 0.4 ms
Lead Channel Setting Pacing Amplitude: 2.4 V
Lead Channel Setting Pacing Amplitude: 2.5 V
Lead Channel Setting Pacing Pulse Width: 0.4 ms
Lead Channel Setting Sensing Sensitivity: 0.6 mV
Pulse Gen Serial Number: 112736

## 2017-04-08 ENCOUNTER — Telehealth: Payer: Self-pay | Admitting: Cardiology

## 2017-04-08 NOTE — Telephone Encounter (Signed)
Patient called to state that he ran out of his amiodarone 200mg  1.5 tablets daily.  [facility] [place] [place] [phone][facility] for 6 tablets and no refills.  Instructed him to call the office tomorrow to get more refills.

## 2017-04-09 ENCOUNTER — Telehealth: Payer: Self-pay | Admitting: Internal Medicine

## 2017-04-09 ENCOUNTER — Other Ambulatory Visit: Payer: Self-pay | Admitting: Internal Medicine

## 2017-04-09 MED ORDER — AMIODARONE HCL 200 MG PO TABS: 300.0000 mg | ORAL_TABLET | Freq: Every day | ORAL | 0 refills | Status: DC

## 2017-04-09 NOTE — Telephone Encounter (Signed)
Spoke with pharmacist at [facility] and he stated that they received the rx this AM, they also had one on file from last night for a quantity of #6 which the patient did not pick up. He had a full refill on file already that they had filled previously but was returned to stock as the patient never picked it up after two weeks. They will get this ready and let patient know.

## 2017-04-09 NOTE — Telephone Encounter (Signed)
°*  STAT* If patient is at the pharmacy, call can be transferred to refill team.   1. Which medications need to be refilled? (please list name of each medication and dose if known) Amidorone   2. Which pharmacy/location (including street and city if local pharmacy) is [facility] [place] [facility] [phone]  3. Do they need a 30 day or 90 day supply? 90

## 2017-04-09 NOTE — Telephone Encounter (Signed)
Pt's medication has already been sent to pt's pharmacy. Pt has an appt with Dr. Graciela Husbands in March. Confirmation received.

## 2017-04-09 NOTE — Telephone Encounter (Signed)
Medication Detail    Disp Refills Start End   amiodarone (PACERONE) 200 MG tablet 135 tablet 0 04/09/2017    Sig - Route: Take 1.5 tablets (300 mg total) by mouth daily. Please keep upcoming appt in March. Thank you - Oral   Sent to pharmacy as: amiodarone (PACERONE) 200 MG tablet   E-Prescribing Status: Receipt confirmed by pharmacy (04/09/2017 8:23 AM EST)   Pharmacy   [id number] - [place], [place] - [address]

## 2017-05-07 ENCOUNTER — Other Ambulatory Visit: Payer: Self-pay | Admitting: *Deleted

## 2017-05-07 MED ORDER — SOTALOL HCL 80 MG PO TABS: 80.0000 mg | ORAL_TABLET | Freq: Two times a day (BID) | ORAL | 0 refills | Status: DC

## 2017-05-17 ENCOUNTER — Encounter: Payer: BLUE CROSS/BLUE SHIELD | Admitting: Internal Medicine

## 2017-05-30 ENCOUNTER — Other Ambulatory Visit: Payer: Self-pay | Admitting: Internal Medicine

## 2017-06-01 ENCOUNTER — Other Ambulatory Visit: Payer: Self-pay | Admitting: Internal Medicine

## 2017-06-05 ENCOUNTER — Other Ambulatory Visit: Payer: Self-pay | Admitting: Internal Medicine

## 2017-06-09 ENCOUNTER — Other Ambulatory Visit: Payer: Self-pay | Admitting: Internal Medicine

## 2017-06-11 NOTE — Telephone Encounter (Signed)
Outpatient Medication Detail    Disp Refills Start End   amiodarone (PACERONE) 200 MG tablet 45 tablet 0 06/06/2017    Sig - Route: Take 1.5 tablets (300 mg total) by mouth daily. Please keep upcoming appt in May before anymore refills. Final Attempt - Oral   Sent to pharmacy as: amiodarone (PACERONE) 200 MG tablet   Notes to Pharmacy: Pt must keep upcoming appointment in May before anymore refills. Final Attempt   E-Prescribing Status: Receipt confirmed by pharmacy (06/06/2017 10:38 AM EDT)   Pharmacy   [id number] - [place], [place] - [address]

## 2017-06-17 ENCOUNTER — Telehealth: Payer: Self-pay | Admitting: Cardiology

## 2017-06-17 ENCOUNTER — Ambulatory Visit (INDEPENDENT_AMBULATORY_CARE_PROVIDER_SITE_OTHER): Payer: BLUE CROSS/BLUE SHIELD | Admitting: *Deleted

## 2017-06-17 DIAGNOSIS — I472 Ventricular tachycardia: Secondary | ICD-10-CM | POA: Diagnosis not present

## 2017-06-17 DIAGNOSIS — I4729 Other ventricular tachycardia: Secondary | ICD-10-CM

## 2017-06-17 NOTE — Telephone Encounter (Signed)
Spoke with pt and reminded pt of remote transmission that is due today. Pt verbalized understanding.   

## 2017-06-18 ENCOUNTER — Encounter: Payer: Self-pay | Admitting: Cardiology

## 2017-06-18 NOTE — Progress Notes (Signed)
Remote ICD transmission.   

## 2017-06-20 LAB — CUP PACEART REMOTE DEVICE CHECK
Battery Remaining Longevity: 114 mo
Battery Remaining Percentage: 100 %
Brady Statistic RA Percent Paced: 23 %
Brady Statistic RV Percent Paced: 0 %
Date Time Interrogation Session: 20190422225700
HighPow Impedance: 49 Ohm
Implantable Lead Implant Date: 20011011
Implantable Lead Implant Date: 20160314
Implantable Lead Location: 753859
Implantable Lead Location: 753860
Implantable Lead Model: 148
Implantable Lead Model: 5076
Implantable Lead Serial Number: 111569
Implantable Pulse Generator Implant Date: 20160314
Lead Channel Impedance Value: 646 Ohm
Lead Channel Impedance Value: 819 Ohm
Lead Channel Pacing Threshold Amplitude: 1.2 V
Lead Channel Pacing Threshold Amplitude: 1.5 V
Lead Channel Pacing Threshold Pulse Width: 0.4 ms
Lead Channel Pacing Threshold Pulse Width: 0.4 ms
Lead Channel Setting Pacing Amplitude: 2.4 V
Lead Channel Setting Pacing Amplitude: 2.5 V
Lead Channel Setting Pacing Pulse Width: 0.4 ms
Lead Channel Setting Sensing Sensitivity: 0.6 mV
Pulse Gen Serial Number: 112736

## 2017-06-26 ENCOUNTER — Telehealth: Payer: Self-pay | Admitting: Internal Medicine

## 2017-06-26 NOTE — Telephone Encounter (Signed)
New Message    1. Has your device fired? no  2. Is you device beeping? no  3. Are you experiencing draining or swelling at device site? no  4. Are you calling to see if we received your device transmission? no  5. Have you passed out? No   Patient states that he recently went through a medical detector. His concern is that he was told that his defibrillator may stop working. Please call to discuss.     Please route to Device Clinic Pool

## 2017-06-26 NOTE — Telephone Encounter (Signed)
Spoke to patient about his ICD. I assured patient that a metal detector would not cause his device to stop working permanently. I told him that he could send a remote transmission to make sure that everything was stable. Patient verbalized understanding and plans to send once he returns home.

## 2017-06-27 NOTE — Telephone Encounter (Signed)
Spoke with pt regarding transmission pt stated that he forgot to send it last night and will send it tonight about 6pm.

## 2017-07-02 ENCOUNTER — Other Ambulatory Visit: Payer: Self-pay | Admitting: Internal Medicine

## 2017-07-11 ENCOUNTER — Encounter: Payer: Self-pay | Admitting: Internal Medicine

## 2017-07-11 ENCOUNTER — Ambulatory Visit (INDEPENDENT_AMBULATORY_CARE_PROVIDER_SITE_OTHER): Payer: BLUE CROSS/BLUE SHIELD | Admitting: Internal Medicine

## 2017-07-11 VITALS — BP 118/90 | HR 52 | Ht 69.0 in | Wt 139.0 lb

## 2017-07-11 DIAGNOSIS — I428 Other cardiomyopathies: Secondary | ICD-10-CM | POA: Diagnosis not present

## 2017-07-11 DIAGNOSIS — I4729 Other ventricular tachycardia: Secondary | ICD-10-CM

## 2017-07-11 DIAGNOSIS — I472 Ventricular tachycardia, unspecified: Secondary | ICD-10-CM

## 2017-07-11 LAB — CUP PACEART INCLINIC DEVICE CHECK
Date Time Interrogation Session: 20190516151838
Implantable Lead Implant Date: 20011011
Implantable Lead Implant Date: 20160314
Implantable Lead Location: 753859
Implantable Lead Location: 753860
Implantable Lead Model: 148
Implantable Lead Model: 5076
Implantable Lead Serial Number: 111569
Implantable Pulse Generator Implant Date: 20160314
Pulse Gen Serial Number: 112736

## 2017-07-11 MED ORDER — AMIODARONE HCL 200 MG PO TABS: 300.0000 mg | ORAL_TABLET | Freq: Every day | ORAL | 3 refills | Status: DC

## 2017-07-11 MED ORDER — AMIODARONE HCL 200 MG PO TABS: 200.0000 mg | ORAL_TABLET | Freq: Every day | ORAL | 3 refills | Status: DC

## 2017-07-11 MED ORDER — SOTALOL HCL 80 MG PO TABS: 80.0000 mg | ORAL_TABLET | Freq: Two times a day (BID) | ORAL | 3 refills | Status: DC

## 2017-07-11 NOTE — Patient Instructions (Addendum)
Medication Instructions:  Your physician has recommended you make the following change in your medication:   1. Decrease your Amiodarone to 200mg , one tablet, per day.  Labwork: You had labs drawn today: CBC, BMP, TSH, and Mg  Testing/Procedures: None ordered.  Follow-Up: Your physician wants you to follow-up in: 6 months with Dr Graciela Husbands. You will receive a reminder letter in the mail two months in advance. If you don't receive a letter, please call our office to schedule the follow-up appointment.  Remote monitoring is used to monitor your Pacemaker of ICD from home. This monitoring reduces the number of office visits required to check your device to one time per year. It allows [place] to keep an eye on the functioning of your device to ensure it is working properly. You are scheduled for a device check from home on 09/16/2017. You may send your transmission at any time that day. If you have a wireless device, the transmission will be sent automatically. After your physician reviews your transmission, you will receive a postcard with your next transmission date.    Any Other Special Instructions Will Be Listed Below (If Applicable).   Please call the office if you are interested in genetic testing for ARVC.    If you need a refill on your cardiac medications before your next appointment, please call your pharmacy.

## 2017-07-11 NOTE — Progress Notes (Signed)
Patient Care Team: Geoffry Paradise, MD as PCP - General (Internal Medicine)   HPI  Ernest Morgan is a 52 y.o. 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 He is doing combination of amiodarone and sotalol for some time now. Genetic testing was discussed at his last visit but was not consummated.  He denies intercurrent ventricular tachycardia.  He is taking his medications;  he underwent device generator replacement a few months ago. He received an atrial lead at that time because of sinus node dysfunction.  Device interrogation 6/15 demonstrated significant interval increase in ventricular tachycardia   sotalol dose was increased at that time and repeat interrogation 7/16 and demonstrated no further ventricular tachycardia.   The patient denies chest pain, shortness of breath, nocturnal dyspnea, orthopnea or peripheral edema.  There have been no palpitations, lightheadedness or syncope.   Patient denies symptoms of GI intolerance, sun sensitivity, neurological symptoms attributable to amiodarone.  Surveillance laboratories were in normal limits when checked    ago  Date Cr K TSH LFTs PFTs  1/18  1.14 4.1 0.897 11              Past Medical History:  Diagnosis Date  . Arrhythmogenic right ventricular cardiomyopathy (HCC)    a. 04/2014 s/p ICD upgrade 2/2 EOL-->BSX E143 Engergen DR DC AICD.  Marland Kitchen ICD -[employer]   . Sinus node dysfunction (HCC)    a. 04/2014 Atrial lead added @ time of ICD upgrade.  . Ventricular tachycardia [facility])     Past Surgical History:  Procedure Laterality Date  . CARDIAC DEFIBRILLATOR PLACEMENT     Guidant - T175 Vitality  . IMPLANTABLE CARDIOVERTER DEFIBRILLATOR (ICD) GENERATOR CHANGE N/A 05/10/2014   Procedure: ICD GENERATOR CHANGE;  Surgeon: Marinus Maw, MD;  Location: [facility] CATH LAB;  Service: Cardiovascular;  Laterality: N/A;    Current Outpatient Medications  Medication Sig Dispense Refill  .  aspirin 81 MG tablet Take 81 mg by mouth daily.    . methimazole (TAPAZOLE) 5 MG tablet Take 5 mg by mouth daily.    . Multiple Vitamin (MULTIVITAMIN) tablet Take 1 tablet by mouth daily.    Marland Kitchen amiodarone (PACERONE) 200 MG tablet Take 1 tablet (200 mg total) by mouth daily. 90 tablet 3  . sotalol (BETAPACE) 80 MG tablet Take 1 tablet (80 mg total) by mouth 2 (two) times daily. 180 tablet 3   No current facility-administered medications for this visit.     Allergies  Allergen Reactions  . Lidocaine     REACTION: Slow Heartrate  . Percocet [Oxycodone-Acetaminophen] Itching    Review of Systems negative except from HPI and PMH  Physical Exam BP 118/90   Pulse (!) 52   Ht 5\' 9"  (1.753 m)   Wt 139 lb (63 kg)   SpO2 98%   BMI 20.53 kg/m  Well developed and well nourished in no acute distress HENT normal E scleral and icterus clear Neck Supple JVP flat; carotids brisk and full Clear to ausculation  Regular rate and rhythm, no murmurs gallops or rub Soft with active bowel sounds No clubbing cyanosis no Edema Alert and oriented, grossly normal motor and sensory function Skin Warm and Dry; discoloration vioalceous   ECG 51  19[birth year] TWI v1-V4  Assessment and  Plan  Arrhythmogenic RV cardiomyopathy  Ventricular tachycardia with history of storm  Implantable defibrillator  [employer]     No intercurrent Ventricular tachycardia  amio  skin toxicity  Will decrease amio 300>>200   Will repeat blood work today  Wants to consider RFCA again but will need to look at benefits of ablation in the absence of shocks   We spent more than 50% of our >25 min visit in face to face counseling regarding the above

## 2017-07-12 LAB — CBC WITH DIFFERENTIAL/PLATELET
Basophils Absolute: 0 10*3/uL (ref 0.0–0.2)
Basos: 1 %
EOS (ABSOLUTE): 0.1 10*3/uL (ref 0.0–0.4)
Eos: 2 %
Hematocrit: 50.8 % (ref 37.5–51.0)
Hemoglobin: 16.9 g/dL (ref 13.0–17.7)
Immature Grans (Abs): 0 10*3/uL (ref 0.0–0.1)
Immature Granulocytes: 0 %
Lymphocytes Absolute: 2.3 10*3/uL (ref 0.7–3.1)
Lymphs: 31 %
MCH: 33.4 pg — ABNORMAL HIGH (ref 26.6–33.0)
MCHC: 33.3 g/dL (ref 31.5–35.7)
MCV: 100 fL — ABNORMAL HIGH (ref 79–97)
Monocytes Absolute: 0.5 10*3/uL (ref 0.1–0.9)
Monocytes: 7 %
Neutrophils Absolute: 4.5 10*3/uL (ref 1.4–7.0)
Neutrophils: 59 %
Platelets: 269 10*3/uL (ref 150–379)
RBC: 5.06 x10E6/uL (ref 4.14–5.80)
RDW: 13.5 % (ref 12.3–15.4)
WBC: 7.5 10*3/uL (ref 3.4–10.8)

## 2017-07-12 LAB — BASIC METABOLIC PANEL
BUN/Creatinine Ratio: 16 (ref 9–20)
BUN: 18 mg/dL (ref 6–24)
CO2: 21 mmol/L (ref 20–29)
Calcium: 9.8 mg/dL (ref 8.7–10.2)
Chloride: 102 mmol/L (ref 96–106)
Creatinine, Ser: 1.13 mg/dL (ref 0.76–1.27)
GFR calc Af Amer: 85 mL/min/{1.73_m2} (ref >59)
GFR calc non Af Amer: 73 mL/min/{1.73_m2} (ref >59)
Glucose: 94 mg/dL (ref 65–99)
Potassium: 5.5 mmol/L — ABNORMAL HIGH (ref 3.5–5.2)
Sodium: 138 mmol/L (ref 134–144)

## 2017-07-12 LAB — HEPATIC FUNCTION PANEL
ALT: 28 IU/L (ref 0–44)
AST: 19 IU/L (ref 0–40)
Albumin: 5 g/dL (ref 3.5–5.5)
Alkaline Phosphatase: 116 IU/L (ref 39–117)
Bilirubin Total: 0.6 mg/dL (ref 0.0–1.2)
Bilirubin, Direct: 0.14 mg/dL (ref 0.00–0.40)
Total Protein: 6.9 g/dL (ref 6.0–8.5)

## 2017-07-12 LAB — TSH: TSH: 1.65 u[IU]/mL (ref 0.450–4.500)

## 2017-07-12 LAB — MAGNESIUM: Magnesium: 2.2 mg/dL (ref 1.6–2.3)

## 2017-07-15 ENCOUNTER — Telehealth: Payer: Self-pay | Admitting: Internal Medicine

## 2017-07-15 NOTE — Telephone Encounter (Signed)
Spoke with pt, pt stated that he got a bill for $148 that he needs to pay for a remote check, pt stated that he is stretched thin right now financially and wanted to know if there were alternative informed pt that he would need to call the billing dept and request a code for in office defib check not a physician check and then give that code to his insurance company to see how much it would cost him out of pocket. Informed pt that if he is not remotely monitored then he would need to be brought in the office every 3 months for a device check, pt voiced understanding informed pt that for now he will still have a remote scheduled for July until he calls and cancels or re-schedules for an in office check. Pt voiced understanding

## 2017-07-15 NOTE — Telephone Encounter (Signed)
New message   Patient calling upset, states he has already spoken to billing and was told to contact device/nurse. Patient states he was unaware of the cost of home remote checks. Patient requesting to speak with  Device nurse or Dr Graciela Husbands to discuss alternatives.

## 2017-07-16 ENCOUNTER — Telehealth: Payer: Self-pay

## 2017-07-16 DIAGNOSIS — Z5181 Encounter for therapeutic drug level monitoring: Secondary | ICD-10-CM

## 2017-07-16 DIAGNOSIS — I4729 Other ventricular tachycardia: Secondary | ICD-10-CM

## 2017-07-16 DIAGNOSIS — I472 Ventricular tachycardia: Secondary | ICD-10-CM

## 2017-07-16 DIAGNOSIS — E875 Hyperkalemia: Secondary | ICD-10-CM

## 2017-07-16 NOTE — Telephone Encounter (Signed)
-----   Message from Duke Salvia, MD sent at 07/15/2017  4:38 PM EDT ----- Please Inform Patient that labs are normal x K considerably elevated  PLEASE REPEAT  Thanks

## 2017-07-16 NOTE — Telephone Encounter (Signed)
Pt is aware and agreeable to normal labs with elevated potassium. Pt is agreeable to repeat potassium level on Friday 5/24. Labs are in an appt scheduled

## 2017-07-19 ENCOUNTER — Other Ambulatory Visit: Payer: BLUE CROSS/BLUE SHIELD

## 2017-07-23 ENCOUNTER — Telehealth: Payer: Self-pay | Admitting: Internal Medicine

## 2017-07-23 NOTE — Telephone Encounter (Signed)
Follow Up:     Pt is returning a call from this morning,he said he did not know who called him.

## 2017-07-23 NOTE — Telephone Encounter (Signed)
Pt called to set up appt for BMP redraw.

## 2017-07-24 ENCOUNTER — Telehealth: Payer: Self-pay | Admitting: Internal Medicine

## 2017-07-24 ENCOUNTER — Other Ambulatory Visit: Payer: BLUE CROSS/BLUE SHIELD | Admitting: *Deleted

## 2017-07-24 DIAGNOSIS — E875 Hyperkalemia: Secondary | ICD-10-CM

## 2017-07-24 DIAGNOSIS — I4729 Other ventricular tachycardia: Secondary | ICD-10-CM

## 2017-07-24 DIAGNOSIS — I472 Ventricular tachycardia: Secondary | ICD-10-CM

## 2017-07-24 DIAGNOSIS — Z5181 Encounter for therapeutic drug level monitoring: Secondary | ICD-10-CM

## 2017-07-24 LAB — BASIC METABOLIC PANEL
BUN/Creatinine Ratio: 13 (ref 9–20)
BUN: 16 mg/dL (ref 6–24)
CO2: 21 mmol/L (ref 20–29)
Calcium: 8.8 mg/dL (ref 8.7–10.2)
Chloride: 103 mmol/L (ref 96–106)
Creatinine, Ser: 1.23 mg/dL (ref 0.76–1.27)
GFR calc Af Amer: 76 mL/min/{1.73_m2} (ref >59)
GFR calc non Af Amer: 66 mL/min/{1.73_m2} (ref >59)
Glucose: 115 mg/dL — ABNORMAL HIGH (ref 65–99)
Potassium: 4.4 mmol/L (ref 3.5–5.2)
Sodium: 139 mmol/L (ref 134–144)

## 2017-07-24 NOTE — Telephone Encounter (Signed)
Called and let pt know his labs have not resulted yet, but we will call him when they do.

## 2017-07-24 NOTE — Telephone Encounter (Signed)
New Message:       Pt is calling to see if his results have come back from lab work yet

## 2017-08-05 ENCOUNTER — Telehealth: Payer: Self-pay | Admitting: Internal Medicine

## 2017-08-05 NOTE — Telephone Encounter (Signed)
Error  ° °Not needed °

## 2017-08-06 ENCOUNTER — Telehealth: Payer: Self-pay | Admitting: *Deleted

## 2017-08-06 NOTE — Telephone Encounter (Signed)
Message received:  Agustina Caroli, RN  Phone Number: [phone]        Ernest Morgan,   Patient spoke with scheduling and the billing dept today. He is concerned because his financial responsibility due is becoming burdensome. He now would like to consider cancelling his future appts as he didn't realize it would amount to any cost.    I explained to Mr. Korinek that we do bill for the Device Clinic services on the 91d remote appointments. We do not bill for alert management or any transmissions sent in the interim. I let him know that our policy would be to see him in the office every 3 months if he did not want to have the remote monitoring appointments. He will call his insurance company with the billing codes and assess costs and call us back with his decision.

## 2017-08-13 ENCOUNTER — Telehealth: Payer: Self-pay | Admitting: Internal Medicine

## 2017-08-13 NOTE — Telephone Encounter (Signed)
New Message:      Pt is calling and states that he is not sure when he will be able to come to another appt due to the cost continuing to go up after his visits.

## 2017-08-13 NOTE — Telephone Encounter (Signed)
Spoke with pt who is upset at recent medical charges. He states his bill is thousands of dollars and cannot afford his charges. I encouraged him to call financial assistance to discuss. I also advised pt to call his insurance company to discuss the charges to be sure charges were billed accordingly.

## 2017-09-16 ENCOUNTER — Encounter: Payer: BLUE CROSS/BLUE SHIELD | Admitting: *Deleted

## 2017-09-16 ENCOUNTER — Telehealth: Payer: Self-pay

## 2017-09-16 NOTE — Telephone Encounter (Signed)
Spoke with pt and reminded pt of remote transmission that is due today. Pt verbalized understanding.   

## 2017-09-18 ENCOUNTER — Encounter: Payer: Self-pay | Admitting: Cardiology

## 2017-11-18 ENCOUNTER — Other Ambulatory Visit: Payer: Self-pay | Admitting: Internal Medicine

## 2017-11-18 DIAGNOSIS — R05 Cough: Secondary | ICD-10-CM

## 2017-11-18 DIAGNOSIS — R059 Cough, unspecified: Secondary | ICD-10-CM

## 2017-12-19 ENCOUNTER — Encounter: Payer: Self-pay | Admitting: Cardiology

## 2017-12-24 ENCOUNTER — Telehealth: Payer: Self-pay | Admitting: Cardiology

## 2017-12-24 NOTE — Telephone Encounter (Signed)
Its a big deal  See if ti works for him to either come in every 5 month

## 2017-12-24 NOTE — Telephone Encounter (Signed)
Patient called and stated that when he completes a remote transmission he receives a bill for over $145.00 for every transmission. Pt wants to know if he can reduce the amount of remote transmissions or in office checks to cut out some medical expenses.

## 2017-12-30 NOTE — Telephone Encounter (Signed)
Spoke w/ pt and he agreed to in office check every 5 months. Pt agreed to an appt on 01-08-2018 at 2:00 PM.

## 2018-01-08 ENCOUNTER — Ambulatory Visit (INDEPENDENT_AMBULATORY_CARE_PROVIDER_SITE_OTHER): Payer: BLUE CROSS/BLUE SHIELD | Admitting: *Deleted

## 2018-01-08 DIAGNOSIS — I472 Ventricular tachycardia, unspecified: Secondary | ICD-10-CM

## 2018-01-08 NOTE — Progress Notes (Signed)
ICD check in clinic. Normal device function. Thresholds and sensing consistent with previous device measurements. Impedance trends stable over time. No evidence of any ventricular arrhythmias. No mode switches. Histogram distribution appropriate for patient and level of activity. No changes made this session. Device programmed at appropriate safety margins. Device programmed to optimize intrinsic conduction. Estimated longevity 8.5 years. Patient education completed including shock plan. ROV with SK 07/15/2018.

## 2018-01-09 LAB — CUP PACEART INCLINIC DEVICE CHECK
Date Time Interrogation Session: 20191113050000
HighPow Impedance: 33 Ohm
HighPow Impedance: 52 Ohm
Implantable Lead Implant Date: 20011011
Implantable Lead Implant Date: 20160314
Implantable Lead Location: 753859
Implantable Lead Location: 753860
Implantable Lead Model: 148
Implantable Lead Model: 5076
Implantable Lead Serial Number: 111569
Implantable Pulse Generator Implant Date: 20160314
Lead Channel Impedance Value: 659 Ohm
Lead Channel Impedance Value: 831 Ohm
Lead Channel Pacing Threshold Amplitude: 0.9 V
Lead Channel Pacing Threshold Amplitude: 1.3 V
Lead Channel Pacing Threshold Pulse Width: 0.4 ms
Lead Channel Pacing Threshold Pulse Width: 0.4 ms
Lead Channel Sensing Intrinsic Amplitude: 2.6 mV
Lead Channel Sensing Intrinsic Amplitude: 5.2 mV
Lead Channel Setting Pacing Amplitude: 2.4 V
Lead Channel Setting Pacing Amplitude: 2.5 V
Lead Channel Setting Pacing Pulse Width: 0.4 ms
Lead Channel Setting Sensing Sensitivity: 0.6 mV
Pulse Gen Serial Number: 112736

## 2018-01-25 ENCOUNTER — Telehealth: Payer: Self-pay | Admitting: Nurse Practitioner

## 2018-01-25 NOTE — Telephone Encounter (Signed)
   Pt called to report that he's been noticing occasional pinch-like pain in his left shoulder above his ICD.  The ICD site itself is not red, swollen, or tender.  No predictability to Ss.  No fever, chills, skin breakdown, presyncope, or syncope.  I advised that if Ss worsen/become more persistent, or he develops Ss outlined above, he should come into the ED today for evaluation however, based on his description, it does not sound like Ss are related to having an ICD (placed several yrs ago).  He will send in remote transmission today.  He will call office on Monday if Ss persist.  Nicolasa Ducking, NP 01/25/2018, 10:37 AM

## 2018-06-13 ENCOUNTER — Other Ambulatory Visit: Payer: Self-pay | Admitting: *Deleted

## 2018-06-13 MED ORDER — SOTALOL HCL 80 MG PO TABS: 80.0000 mg | ORAL_TABLET | Freq: Two times a day (BID) | ORAL | 0 refills | Status: DC

## 2018-07-10 ENCOUNTER — Telehealth: Payer: Self-pay

## 2018-07-10 NOTE — Telephone Encounter (Signed)
Virtual Visit Pre-Appointment Phone Call  "(Name), I am calling you today to discuss your upcoming appointment. We are currently trying to limit exposure to the virus that causes COVID-19 by seeing patients at home rather than in the office."  1. "What is the BEST phone number to call the day of the visit?" - include this in appointment notes  2. "Do you have or have access to (through a family member/friend) a smartphone with video capability that we can use for your visit?" a. If yes - list this number in appt notes as "cell" (if different from BEST phone #) and list the appointment type as a VIDEO visit in appointment notes b. If no - list the appointment type as a PHONE visit in appointment notes  3. Confirm consent - "In the setting of the current Covid19 crisis, you are scheduled for a (phone or video) visit with your provider on (date) at (time).  Just as we do with many in-office visits, in order for you to participate in this visit, we must obtain consent.  If you'd like, I can send this to your mychart (if signed up) or email for you to review.  Otherwise, I can obtain your verbal consent now.  All virtual visits are billed to your insurance company just like a normal visit would be.  By agreeing to a virtual visit, we'd like you to understand that the technology does not allow for your provider to perform an examination, and thus may limit your provider's ability to fully assess your condition. If your provider identifies any concerns that need to be evaluated in person, we will make arrangements to do so.  Finally, though the technology is pretty good, we cannot assure that it will always work on either your or our end, and in the setting of a video visit, we may have to convert it to a phone-only visit.  In either situation, we cannot ensure that we have a secure connection.  Are you willing to proceed?" STAFF: Did the patient verbally acknowledge consent to telehealth visit? Document  YES/NO here: YES  4. Advise patient to be prepared - "Two hours prior to your appointment, go ahead and check your blood pressure, pulse, oxygen saturation, and your weight (if you have the equipment to check those) and write them all down. When your visit starts, your provider will ask you for this information. If you have an Apple Watch or Kardia device, please plan to have heart rate information ready on the day of your appointment. Please have a pen and paper handy nearby the day of the visit as well."  5. Give patient instructions for MyChart download to smartphone OR Doximity/Doxy.me as below if video visit (depending on what platform provider is using)  6. Inform patient they will receive a phone call 15 minutes prior to their appointment time (may be from unknown caller ID) so they should be prepared to answer    TELEPHONE CALL NOTE  Ernest Morgan has been deemed a candidate for a follow-up tele-health visit to limit community exposure during the Covid-19 pandemic. I spoke with the patient via phone to ensure availability of phone/video source, confirm preferred email & phone number, and discuss instructions and expectations.  I reminded Ernest Morgan to be prepared with any vital sign and/or heart rhythm information that could potentially be obtained via home monitoring, at the time of his visit. I reminded Ernest Morgan to expect a phone call prior to his visit.  Lajoyce Corners, CMA 07/10/2018 2:04 PM   INSTRUCTIONS FOR DOWNLOADING THE MYCHART APP TO SMARTPHONE  - The patient must first make sure to have activated MyChart and know their login information - If Apple, go to [employer] and type in MyChart in the search bar and download the app. If Android, ask patient to go to [employer] and type in [place] in the search bar and download the app. The app is free but as with any other app downloads, their phone may require them to verify saved payment information or Apple/Android  password.  - The patient will need to then log into the app with their MyChart username and password, and select  as their healthcare provider to link the account. When it is time for your visit, go to the MyChart app, find appointments, and click Begin Video Visit. Be sure to Select Allow for your device to access the Microphone and Camera for your visit. You will then be connected, and your provider will be with you shortly.  **If they have any issues connecting, or need assistance please contact MyChart service desk [phone])**  **If using a computer, in order to ensure the best quality for their visit they will need to use either of the following Internet Browsers: [employer] or Google Chrome**  IF USING DOXIMITY or DOXY.ME - The patient will receive a link just prior to their visit by text.     FULL LENGTH CONSENT FOR TELE-HEALTH VISIT   I hereby voluntarily request, consent and authorize [facility] and its employed or contracted physicians, physician assistants, nurse practitioners or other licensed health care professionals (the Practitioner), to provide me with telemedicine health care services (the "Services") as deemed necessary by the treating Practitioner. I acknowledge and consent to receive the Services by the Practitioner via telemedicine. I understand that the telemedicine visit will involve communicating with the Practitioner through live audiovisual communication technology and the disclosure of certain medical information by electronic transmission. I acknowledge that I have been given the opportunity to request an in-person assessment or other available alternative prior to the telemedicine visit and am voluntarily participating in the telemedicine visit.  I understand that I have the right to withhold or withdraw my consent to the use of telemedicine in the course of my care at any time, without affecting my right to future care or treatment,  and that the Practitioner or I may terminate the telemedicine visit at any time. I understand that I have the right to inspect all information obtained and/or recorded in the course of the telemedicine visit and may receive copies of available information for a reasonable fee.  I understand that some of the potential risks of receiving the Services via telemedicine include:  Marland Kitchen Delay or interruption in medical evaluation due to technological equipment failure or disruption; . Information transmitted may not be sufficient (e.g. poor resolution of images) to allow for appropriate medical decision making by the Practitioner; and/or  . In rare instances, security protocols could fail, causing a breach of personal health information.  Furthermore, I acknowledge that it is my responsibility to provide information about my medical history, conditions and care that is complete and accurate to the best of my ability. I acknowledge that Practitioner's advice, recommendations, and/or decision may be based on factors not within their control, such as incomplete or inaccurate data provided by me or distortions of diagnostic images or specimens that may result from electronic transmissions. I understand that  the practice of medicine is not an exact science and that Practitioner makes no warranties or guarantees regarding treatment outcomes. I acknowledge that I will receive a copy of this consent concurrently upon execution via email to the email address I last provided but may also request a printed copy by calling the office of [place].    I understand that my insurance will be billed for this visit.   I have read or had this consent read to me. . I understand the contents of this consent, which adequately explains the benefits and risks of the Services being provided via telemedicine.  . I have been provided ample opportunity to ask questions regarding this consent and the Services and have had my questions  answered to my satisfaction. . I give my informed consent for the services to be provided through the use of telemedicine in my medical care  By participating in this telemedicine visit I agree to the above.

## 2018-07-11 ENCOUNTER — Telehealth: Payer: Self-pay

## 2018-07-11 ENCOUNTER — Encounter: Payer: Self-pay | Admitting: Internal Medicine

## 2018-07-11 ENCOUNTER — Telehealth (INDEPENDENT_AMBULATORY_CARE_PROVIDER_SITE_OTHER): Payer: BLUE CROSS/BLUE SHIELD | Admitting: Internal Medicine

## 2018-07-11 ENCOUNTER — Other Ambulatory Visit: Payer: Self-pay

## 2018-07-11 ENCOUNTER — Ambulatory Visit (INDEPENDENT_AMBULATORY_CARE_PROVIDER_SITE_OTHER): Payer: BLUE CROSS/BLUE SHIELD | Admitting: *Deleted

## 2018-07-11 VITALS — Ht 69.0 in | Wt 144.0 lb

## 2018-07-11 DIAGNOSIS — I472 Ventricular tachycardia, unspecified: Secondary | ICD-10-CM

## 2018-07-11 DIAGNOSIS — E0590 Thyrotoxicosis, unspecified without thyrotoxic crisis or storm: Secondary | ICD-10-CM

## 2018-07-11 DIAGNOSIS — Z5181 Encounter for therapeutic drug level monitoring: Secondary | ICD-10-CM

## 2018-07-11 DIAGNOSIS — Z79899 Other long term (current) drug therapy: Secondary | ICD-10-CM

## 2018-07-11 DIAGNOSIS — I428 Other cardiomyopathies: Secondary | ICD-10-CM

## 2018-07-11 LAB — CUP PACEART REMOTE DEVICE CHECK
Battery Remaining Longevity: 96 mo
Battery Remaining Percentage: 100 %
Brady Statistic RA Percent Paced: 24 %
Brady Statistic RV Percent Paced: 0 %
Date Time Interrogation Session: 20200515145424
HighPow Impedance: 54 Ohm
Implantable Lead Implant Date: 20011011
Implantable Lead Implant Date: 20160314
Implantable Lead Location: 753859
Implantable Lead Location: 753860
Implantable Lead Model: 148
Implantable Lead Model: 5076
Implantable Lead Serial Number: 111569
Implantable Pulse Generator Implant Date: 20160314
Lead Channel Impedance Value: 700 Ohm
Lead Channel Impedance Value: 843 Ohm
Lead Channel Pacing Threshold Amplitude: 0.9 V
Lead Channel Pacing Threshold Amplitude: 1.3 V
Lead Channel Pacing Threshold Pulse Width: 0.4 ms
Lead Channel Pacing Threshold Pulse Width: 0.4 ms
Lead Channel Setting Pacing Amplitude: 2.4 V
Lead Channel Setting Pacing Amplitude: 2.5 V
Lead Channel Setting Pacing Pulse Width: 0.4 ms
Lead Channel Setting Sensing Sensitivity: 0.6 mV
Pulse Gen Serial Number: 112736

## 2018-07-11 MED ORDER — METHIMAZOLE 5 MG PO TABS: 5.0000 mg | ORAL_TABLET | Freq: Every day | ORAL | 3 refills | Status: DC

## 2018-07-11 NOTE — Telephone Encounter (Signed)
LMOVM to send a manual transmission with his home monitor as soon as he can. I left my direct office number for the pt to call back if he have any questions.

## 2018-07-11 NOTE — Progress Notes (Signed)
I called the pt and left a voicemail for him to send a transmission with his home monitor. I left the device clinic number if he has any questions.

## 2018-07-11 NOTE — Patient Instructions (Signed)
Medication Instructions:  No changes  Labwork: CMET and TSH next week  Testing/Procedures: Echo in 2 months will call you to schedule  Follow-Up: Your physician recommends that you schedule a follow-up appointment in: 6 months with Dr. Graciela Husbands and ASAP remote device check     Any Other Special Instructions Will Be Listed Below (If Applicable).     If you need a refill on your cardiac medications before your next appointment, please call your pharmacy.

## 2018-07-11 NOTE — Progress Notes (Signed)
Electrophysiology TeleHealth Note   Due to national recommendations of social distancing due to COVID 19, an audio/video telehealth visit is felt to be most appropriate for this patient at this time.The patient did not have access to video technology/had technical difficulties with video requiring transitioning to audio format only (telephone).  All issues noted in this document were discussed and addressed.  No physical exam could be performed with this format.      See MyChart message from today for the patient's consent to telehealth for [facility].   Date:  07/11/2018   ID:  Ernest Morgan, DOB Nov 13, 1965, MRN [id number]  Location: patient's home  Provider location: [address], [place]  Evaluation Performed: Follow-up visit  PCP:  Geoffry Paradise, MD  Cardiologist:     Electrophysiologist:  SK   Chief Complaint: VT   History of Present Illness:    Ernest Morgan is a 53 y.o. male who presents via audio/video conferencing for a telehealth visit today.  Since last being seen in our clinic, the patient reports doing reasonably well.   Some SOB but no worse over recent years,  No PND or orthopnea or edema Patient denies symptoms of GI intolerance, sun sensitivity, neurological symptoms attributable to amiodarone.  Surveillance laboratories were in normal limits when checked 1 years  ago  Is on methimazole;  Checked [facility] and no labs within a year    The patient denies symptoms of fevers, chills, cough, or new SOB worrisome for COVID 19.    Past Medical History:  Diagnosis Date  . Arrhythmogenic right ventricular cardiomyopathy (HCC)    a. 04/2014 s/p ICD upgrade 2/2 EOL-->BSX E143 Engergen DR DC AICD.  Marland Kitchen ICD -[employer]   . Sinus node dysfunction (HCC)    a. 04/2014 Atrial lead added @ time of ICD upgrade.  . Ventricular tachycardia [facility])     Past Surgical History:  Procedure Laterality Date  . CARDIAC DEFIBRILLATOR PLACEMENT     Guidant - T175  Vitality  . IMPLANTABLE CARDIOVERTER DEFIBRILLATOR (ICD) GENERATOR CHANGE N/A 05/10/2014   Procedure: ICD GENERATOR CHANGE;  Surgeon: Marinus Maw, MD;  Location: [facility] CATH LAB;  Service: Cardiovascular;  Laterality: N/A;    Current Outpatient Medications  Medication Sig Dispense Refill  . amiodarone (PACERONE) 200 MG tablet Take 1 tablet (200 mg total) by mouth daily. 90 tablet 3  . aspirin 81 MG tablet Take 81 mg by mouth daily.    . methimazole (TAPAZOLE) 5 MG tablet Take 1 tablet (5 mg total) by mouth daily. 90 tablet 3  . Multiple Vitamin (MULTIVITAMIN) tablet Take 1 tablet by mouth daily.    . sotalol (BETAPACE) 80 MG tablet Take 1 tablet (80 mg total) by mouth 2 (two) times daily. 180 tablet 0   No current facility-administered medications for this visit.     Allergies:   Lidocaine and Percocet [oxycodone-acetaminophen]   Social History:  The patient  reports that he has never smoked. He has never used smokeless tobacco. He reports that he does not drink alcohol or use drugs.   Family History:  The patient's   family history includes Diabetes in his father and mother; Heart disease in his mother.   ROS:  Please see the history of present illness.   All other systems are personally reviewed and negative.    Exam:    Vital Signs:  Ht 5\' 9"  (1.753 m)   Wt 144 lb (65.3 kg)   BMI 21.27  kg/m    Before lost video technology Well appearing, alert and conversant, regular work of breathing,  good skin color Eyes- anicteric, neuro- grossly intact, skin- no apparent rash or lesions or cyanosis, mouth- oral mucosa is pink   Labs/Other Tests and Data Reviewed:    Recent Labs: 07/11/2017: ALT 28; Hemoglobin 16.9; Magnesium 2.2; Platelets 269; TSH 1.650 07/24/2017: BUN 16; Creatinine, Ser 1.23; Potassium 4.4; Sodium 139   Wt Readings from Last 3 Encounters:  07/11/18 144 lb (65.3 kg)  07/11/17 139 lb (63 kg)  03/27/16 135 lb (61.2 kg)     Other studies personally reviewed:  Additional studies/ records that were reviewed today include:  None  reviewed from [facility] PDF 11/19 * which revealed normal device function,   arrhythmias - none    ASSESSMENT & PLAN:    Arrhythmogenic RV cardiomyopathy  Ventricular tachycardia with history of storm  Implantable defibrillator  [employer]   Hyperthyroidism  High Risk Medication Surveillance  Pt seems to be tolerating meds symptom wise, need surviellance labs    Last EF 2016 was 40-45% Will need repeat echo, clinically without RV failure or severe LV failure    COVID 19 screen The patient denies symptoms of COVID 19 at this time.  The importance of social distancing was discussed today.  Follow-up:  71m Next remote:ASAP  Current medicines are reviewed at length with the patient today.   The patient does not have concerns regarding his medicines.  The following changes were made today:  none  Labs/ tests ordered today include: CMET, TSH No orders of the defined types were placed in this encounter.   Future tests ( post COVID )  Echo in 2   months  Patient Risk:  after full review of this patients clinical status, I feel that they are at moderate risk at this time.  Today, I have spent 9 minutes with the patient with telehealth technology discussing the above.  Signed, Sherryl Manges, MD  07/11/2018 9:03 AM     [facility] [address] [place] [address] [phone] (office) [phone] (fax)

## 2018-07-11 NOTE — Telephone Encounter (Signed)
Pt transmission received 07/11/2018

## 2018-07-11 NOTE — Telephone Encounter (Signed)
The pt tried to send a transmission with his home monitor however it gave him an error code and turned off. I gave him the number to [employer] support to get additional help.

## 2018-07-14 ENCOUNTER — Other Ambulatory Visit: Payer: Self-pay

## 2018-07-14 NOTE — Progress Notes (Signed)
Remote ICD transmission.   

## 2018-07-15 ENCOUNTER — Encounter: Payer: BLUE CROSS/BLUE SHIELD | Admitting: Internal Medicine

## 2018-07-17 ENCOUNTER — Other Ambulatory Visit: Payer: Self-pay

## 2018-07-17 ENCOUNTER — Other Ambulatory Visit: Payer: BLUE CROSS/BLUE SHIELD | Admitting: *Deleted

## 2018-07-17 DIAGNOSIS — Z5181 Encounter for therapeutic drug level monitoring: Secondary | ICD-10-CM

## 2018-07-17 DIAGNOSIS — I428 Other cardiomyopathies: Secondary | ICD-10-CM

## 2018-07-17 DIAGNOSIS — E0590 Thyrotoxicosis, unspecified without thyrotoxic crisis or storm: Secondary | ICD-10-CM

## 2018-07-17 DIAGNOSIS — I472 Ventricular tachycardia, unspecified: Secondary | ICD-10-CM

## 2018-07-17 DIAGNOSIS — Z79899 Other long term (current) drug therapy: Secondary | ICD-10-CM

## 2018-07-18 ENCOUNTER — Telehealth: Payer: Self-pay | Admitting: Internal Medicine

## 2018-07-18 LAB — COMPREHENSIVE METABOLIC PANEL
ALT: 17 IU/L (ref 0–44)
AST: 18 IU/L (ref 0–40)
Albumin/Globulin Ratio: 2.2 (ref 1.2–2.2)
Albumin: 4.3 g/dL (ref 3.8–4.9)
Alkaline Phosphatase: 117 IU/L (ref 39–117)
BUN/Creatinine Ratio: 14 (ref 9–20)
BUN: 14 mg/dL (ref 6–24)
Bilirubin Total: 0.3 mg/dL (ref 0.0–1.2)
CO2: 22 mmol/L (ref 20–29)
Calcium: 9.1 mg/dL (ref 8.7–10.2)
Chloride: 101 mmol/L (ref 96–106)
Creatinine, Ser: 1.02 mg/dL (ref 0.76–1.27)
GFR calc Af Amer: 95 mL/min/{1.73_m2} (ref >59)
GFR calc non Af Amer: 82 mL/min/{1.73_m2} (ref >59)
Globulin, Total: 2 g/dL (ref 1.5–4.5)
Glucose: 104 mg/dL — ABNORMAL HIGH (ref 65–99)
Potassium: 4.1 mmol/L (ref 3.5–5.2)
Sodium: 138 mmol/L (ref 134–144)
Total Protein: 6.3 g/dL (ref 6.0–8.5)

## 2018-07-18 LAB — TSH: TSH: 2.09 u[IU]/mL (ref 0.450–4.500)

## 2018-07-18 NOTE — Telephone Encounter (Signed)
Patient is calling for his lab results. °

## 2018-07-18 NOTE — Telephone Encounter (Signed)
Pt returned this office call please give him a call back. °

## 2018-07-18 NOTE — Telephone Encounter (Signed)
Advised pt his lab work looked normal, but Dr Graciela Husbands has not reviewed or made recommendations on it yet. We will call him back when receiving recommendations from Dr Graciela Husbands.

## 2018-08-14 ENCOUNTER — Encounter: Payer: Self-pay | Admitting: Podiatry

## 2018-08-14 ENCOUNTER — Ambulatory Visit (INDEPENDENT_AMBULATORY_CARE_PROVIDER_SITE_OTHER): Payer: BC Managed Care – PPO | Admitting: Podiatry

## 2018-08-14 ENCOUNTER — Other Ambulatory Visit: Payer: Self-pay

## 2018-08-14 VITALS — BP 117/77 | HR 53 | Temp 98.0°F

## 2018-08-14 DIAGNOSIS — B351 Tinea unguium: Secondary | ICD-10-CM

## 2018-08-20 NOTE — Progress Notes (Signed)
Subjective:   Patient ID: Ernest Morgan, male   DOB: 53 y.o.   MRN: [id number]   HPI 53 year old male presents the office today for concerns of his toenails on the right side becoming thickened discolored on all nails 1 through 5.  He has been getting worse the last 2 to 3 months.  He has no pain to the nails and he denies any redness or drainage or any swelling.  Said no recent treatment.  He has no other concerns.   Review of Systems  All other systems reviewed and are negative.  Past Medical History:  Diagnosis Date  . Arrhythmogenic right ventricular cardiomyopathy ([place])    a. 04/2014 s/p ICD upgrade 2/2 EOL-->BSX E143 Engergen DR DC AICD.  Marland Kitchen ICD -[employer]   . Sinus node dysfunction ([place])    a. 04/2014 Atrial lead added @ time of ICD upgrade.  . Ventricular tachycardia [facility])     Past Surgical History:  Procedure Laterality Date  . CARDIAC DEFIBRILLATOR PLACEMENT     Guidant - T175 Vitality  . IMPLANTABLE CARDIOVERTER DEFIBRILLATOR (ICD) GENERATOR CHANGE N/A 05/10/2014   Procedure: ICD GENERATOR CHANGE;  Surgeon: Evans Lance, MD;  Location: [facility] CATH LAB;  Service: Cardiovascular;  Laterality: N/A;     Current Outpatient Medications:  .  amiodarone (PACERONE) 200 MG tablet, Take 1 tablet (200 mg total) by mouth daily., Disp: 90 tablet, Rfl: 3 .  aspirin 81 MG tablet, Take 81 mg by mouth daily., Disp: , Rfl:  .  methimazole (TAPAZOLE) 5 MG tablet, Take 1 tablet (5 mg total) by mouth daily., Disp: 90 tablet, Rfl: 3 .  Multiple Vitamin (MULTIVITAMIN) tablet, Take 1 tablet by mouth daily., Disp: , Rfl:  .  NON FORMULARY, [facility]  Anti-fungal (nail)- #1, Disp: , Rfl:  .  sotalol (BETAPACE) 80 MG tablet, Take 1 tablet (80 mg total) by mouth 2 (two) times daily., Disp: 180 tablet, Rfl: 0  Allergies  Allergen Reactions  . Lidocaine     REACTION: Slow Heartrate  . Percocet [Oxycodone-Acetaminophen] Itching         Objective:  Physical Exam  General: AAO  x3, NAD  Dermatological: Much better the nails on the right foot are hypertrophic, dystrophic with yellow-brown discoloration.  There is no pain, there is no surrounding redness or drainage or any signs of infection.  No open lesions.  Vascular: Dorsalis Pedis artery and Posterior Tibial artery pedal pulses are 2/4 bilateral with immedate capillary fill time.There is no pain with calf compression, swelling, warmth, erythema.   Neruologic: Grossly intact via light touch bilateral. Protective threshold with Semmes Wienstein monofilament intact to all pedal sites bilateral.  Musculoskeletal: No gross boney pedal deformities bilateral. No pain, crepitus, or limitation noted with foot and ankle range of motion bilateral. Muscular strength 5/5 in all groups tested bilateral.  Gait: Unassisted, Nonantalgic.       Assessment:   53 year old male with onychomycosis right side     Plan:  -Treatment options discussed including all alternatives, risks, and complications -Etiology of symptoms were discussed -We discussed treatment options for nail fungus.  Discussed nail removal but no guarantees can come back in normal.  After discussion regards to options will proceed with topical.  Ordered topical antifungal today through [employer].  Discussed side effects and usage.  Trula Slade DPM

## 2018-09-05 ENCOUNTER — Other Ambulatory Visit (HOSPITAL_COMMUNITY): Payer: Self-pay

## 2018-09-10 ENCOUNTER — Other Ambulatory Visit (HOSPITAL_COMMUNITY): Payer: Self-pay

## 2018-09-15 ENCOUNTER — Ambulatory Visit (HOSPITAL_COMMUNITY): Payer: BC Managed Care – PPO | Attending: Internal Medicine

## 2018-09-15 ENCOUNTER — Other Ambulatory Visit: Payer: Self-pay

## 2018-09-15 DIAGNOSIS — I428 Other cardiomyopathies: Secondary | ICD-10-CM | POA: Diagnosis not present

## 2018-09-15 DIAGNOSIS — I472 Ventricular tachycardia, unspecified: Secondary | ICD-10-CM

## 2018-09-15 DIAGNOSIS — Z5181 Encounter for therapeutic drug level monitoring: Secondary | ICD-10-CM | POA: Insufficient documentation

## 2018-09-15 DIAGNOSIS — Z79899 Other long term (current) drug therapy: Secondary | ICD-10-CM | POA: Insufficient documentation

## 2018-09-15 DIAGNOSIS — E0590 Thyrotoxicosis, unspecified without thyrotoxic crisis or storm: Secondary | ICD-10-CM

## 2018-09-23 ENCOUNTER — Telehealth: Payer: Self-pay | Admitting: Internal Medicine

## 2018-09-23 NOTE — Telephone Encounter (Signed)
New message:     Patient states some one called him concerning some results.

## 2018-09-23 NOTE — Telephone Encounter (Signed)
Pt aware of echo results; no additional questions.  

## 2018-09-29 ENCOUNTER — Telehealth: Payer: Self-pay | Admitting: Internal Medicine

## 2018-09-29 ENCOUNTER — Other Ambulatory Visit: Payer: Self-pay

## 2018-09-29 MED ORDER — AMIODARONE HCL 200 MG PO TABS: 200.0000 mg | ORAL_TABLET | Freq: Every day | ORAL | 2 refills | Status: DC

## 2018-09-29 NOTE — Telephone Encounter (Signed)
New Message     *STAT* If patient is at the pharmacy, call can be transferred to refill team.   1. Which medications need to be refilled? (please list name of each medication and dose if known) Amiodarone 200mg   2. Which pharmacy/location (including street and city if local pharmacy) is medication to be sent to? [facility] [place], [place]  3. Do they need a 30 day or 90 day supply? 90 day supply

## 2018-10-10 ENCOUNTER — Encounter: Payer: Self-pay | Admitting: *Deleted

## 2018-11-19 ENCOUNTER — Other Ambulatory Visit: Payer: Self-pay | Admitting: Cardiology

## 2018-11-19 DIAGNOSIS — I472 Ventricular tachycardia, unspecified: Secondary | ICD-10-CM

## 2018-11-19 MED ORDER — SOTALOL HCL 80 MG PO TABS: 80.0000 mg | ORAL_TABLET | Freq: Two times a day (BID) | ORAL | 0 refills | Status: DC

## 2019-02-13 ENCOUNTER — Other Ambulatory Visit: Payer: Self-pay | Admitting: Cardiology

## 2019-02-13 DIAGNOSIS — I472 Ventricular tachycardia, unspecified: Secondary | ICD-10-CM

## 2019-04-14 ENCOUNTER — Telehealth: Payer: Self-pay | Admitting: Internal Medicine

## 2019-04-14 NOTE — Telephone Encounter (Signed)
We are recommending the COVID-19 vaccine to all of our patients. Cardiac medications (including blood thinners) should not deter anyone from being vaccinated and there is no need to hold any of those medications prior to vaccine administration.     Currently, there is a hotline to call (active 03/06/19) to schedule vaccination appointments as no walk-ins will be accepted.   Number: [phone].    If an appointment is not available please go to [email or web address] to sign up for notification when additional vaccine appointments are available.   If you have further questions or concerns about the vaccine process, please visit [email or web address] or contact your primary care physician.  Patient Verbalized Understanding.

## 2019-06-11 ENCOUNTER — Telehealth: Payer: Self-pay | Admitting: Internal Medicine

## 2019-06-11 NOTE — Telephone Encounter (Signed)
We are recommending the COVID-19 vaccine to all of our patients. Cardiac medications (including blood thinners) should not deter anyone from being vaccinated and there is no need to hold any of those medications prior to vaccine administration.     Currently, there is a hotline to call (active 03/06/19) to schedule vaccination appointments as no walk-ins will be accepted.   Number: [phone].    If an appointment is not available please go to [email or web address] to sign up for notification when additional vaccine appointments are available.   If you have further questions or concerns about the vaccine process, please visit [email or web address] or contact your primary care physician.   

## 2019-06-17 ENCOUNTER — Other Ambulatory Visit: Payer: Self-pay | Admitting: Internal Medicine

## 2019-06-17 MED ORDER — AMIODARONE HCL 200 MG PO TABS: 200.0000 mg | ORAL_TABLET | Freq: Every day | ORAL | 0 refills | Status: DC

## 2019-06-17 NOTE — Telephone Encounter (Signed)
Pt's medication was sent to pt's pharmacy as requested. Confirmation received.  °

## 2019-07-17 ENCOUNTER — Other Ambulatory Visit: Payer: Self-pay | Admitting: Internal Medicine

## 2019-07-17 ENCOUNTER — Other Ambulatory Visit: Payer: Self-pay | Admitting: Cardiology

## 2019-07-17 DIAGNOSIS — I472 Ventricular tachycardia, unspecified: Secondary | ICD-10-CM

## 2019-07-21 ENCOUNTER — Other Ambulatory Visit: Payer: Self-pay | Admitting: Internal Medicine

## 2019-07-22 ENCOUNTER — Other Ambulatory Visit: Payer: Self-pay | Admitting: Internal Medicine

## 2019-07-22 ENCOUNTER — Telehealth: Payer: Self-pay | Admitting: Physician Assistant

## 2019-07-22 MED ORDER — METHIMAZOLE 5 MG PO TABS: 5.0000 mg | ORAL_TABLET | Freq: Every day | ORAL | 0 refills | Status: AC

## 2019-07-22 NOTE — Telephone Encounter (Signed)
Patient called after hours and stated that he run out of methimazole. Last dose this morning. He has seen his PCP 2 weeks ago. #15 tablet prescribed. Advised to get more refills from his PCP.   He is also due to see Dr. Graciela Husbands. He will call us to make appointment. On Amiodarone and sotalol.

## 2019-07-29 ENCOUNTER — Other Ambulatory Visit: Payer: Self-pay | Admitting: Internal Medicine

## 2019-07-29 MED ORDER — AMIODARONE HCL 200 MG PO TABS: 200.0000 mg | ORAL_TABLET | Freq: Every day | ORAL | 0 refills | Status: DC

## 2019-07-29 NOTE — Telephone Encounter (Signed)
*  STAT* If patient is at the pharmacy, call can be transferred to refill team.   1. Which medications need to be refilled? (please list name of each medication and dose if known)  amiodarone (PACERONE) 200 MG tablet methimazole (TAPAZOLE) 5 MG tablet  2. Which pharmacy/location (including street and city if local pharmacy) is medication to be sent to?  [id number] [place] [place] - [address]  3. Do they need a 30 day or 90 day supply? 90  Patient could not get an appointment to see Dr. Graciela Husbands until 11/10/19.

## 2019-07-29 NOTE — Telephone Encounter (Signed)
Pt's pharmacy is requesting a refill on methimazole. Would Dr. Graciela Husbands like to refill this medication? Please address

## 2019-07-30 ENCOUNTER — Other Ambulatory Visit: Payer: Self-pay | Admitting: Physician Assistant

## 2019-08-03 ENCOUNTER — Other Ambulatory Visit: Payer: Self-pay

## 2019-08-03 NOTE — Telephone Encounter (Signed)
Pt called in asking for refill of tapazole (methimazole) and I explained he needs to get that from PCP not Dr Graciela Husbands.    Telephone 07/22/2019 [facility] [address]   [place], [place], [place] Cardiology  Conversation ([address]) [place], [place]     07/22/19 7:03 PM Note   Patient called after hours and stated that he run out of methimazole. Last dose this morning. He has seen his PCP 2 weeks ago. #15 tablet prescribed. Advised to get more refills from his PCP.   He is also due to see Dr. Graciela Husbands. He will call us to make appointment. On Amiodarone and sotalol.

## 2019-08-05 ENCOUNTER — Telehealth: Payer: Self-pay | Admitting: Internal Medicine

## 2019-08-05 NOTE — Telephone Encounter (Signed)
Pt called back and states that he is almost out of his medication already. He was under the impression that the pharmacy gave him a 90 day supply on his last fill but he would be out by the end of next week.  The patient picked his medication up from the pharmacy 04/21. He is unsure as to why he is already running out so soon

## 2019-08-05 NOTE — Telephone Encounter (Signed)
Spoke with pt who states he takes Amiodarone 200mg  daily.  Pt reports he picked up last 90 day Rx on 06/17/2019 and will run out of medication next week.  Pt states he takes one tablet daily as prescribed and is unsure why he would be out a month early.  Pt states he has called pharmacy who states he cannot have new refill until July 6,2021 according to insurance company.  Pt advised Amiodarone #90 with 0 RF submitted on 06/17/2019 and 07/29/2019.  Encouraged pt to contact insurance company to request an override or pt may have to pay out of pocket for next refill.  Pt verbalizes understanding and agrees with current plan.

## 2019-08-05 NOTE — Telephone Encounter (Signed)
.  Pt c/o medication issue:  1. Name of Medication: amiodarone (PACERONE) 200 MG tablet  2. How are you currently taking this medication (dosage and times per day)? Take 1 tablet (200 mg total) by mouth daily.   3. Are you having a reaction (difficulty breathing--STAT)?  NA  4. What is your medication issue? Per answering service message, patient has some questions about this medication

## 2019-08-27 ENCOUNTER — Other Ambulatory Visit: Payer: Self-pay | Admitting: *Deleted

## 2019-08-27 DIAGNOSIS — I472 Ventricular tachycardia, unspecified: Secondary | ICD-10-CM

## 2019-08-27 MED ORDER — SOTALOL HCL 80 MG PO TABS: 80.0000 mg | ORAL_TABLET | Freq: Two times a day (BID) | ORAL | 3 refills | Status: DC

## 2019-09-14 ENCOUNTER — Other Ambulatory Visit: Payer: Self-pay

## 2019-09-14 MED ORDER — AMIODARONE HCL 200 MG PO TABS: 200.0000 mg | ORAL_TABLET | Freq: Every day | ORAL | 0 refills | Status: DC

## 2019-11-02 NOTE — Progress Notes (Signed)
Cardiology Office Note Date:  11/02/2019  Patient ID:  Ernest Morgan, Ernest Morgan March 25, 1965, MRN [id number] PCP:  Geoffry Paradise, MD  Cardiologist:  Dr. Graciela Husbands   Chief Complaint: annual visit  History of Present Illness: Ajamu Maxon is a 54 y.o. male with history of ARV dysplasia, VT (storm)  w/ICD on amiodarone and Sotaol, sinus node dysfunction, hyperthyroidism,   He comes today to be seen for Dr. Graciela Husbands, last seen by him may 2020.  He was doing well, planned to update his echo, labs TTE noted LVEF 50-55%, mild asymmetric  left ventricular hypertrophy, right ventricle has moderately reduced systolic function. The cavity was moderately enlarged.  RV was felt to be stable.  He is doing well.  No exertional incapacities.  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 No CP, palpitations. No near syncope or syncope. He saw his eye doctor who told him he had "cloudy appearance" behind his cornea and mentioned something about his amiodarone.  He says that early in the mornings he has a kind of blurred or cloudy vision that clears up after a few minutes He has not had any shocks  Device information BSCi dual chamber ICD, original implant single lead 2001, A lead added at gen change 05/10/14, Dr. Graciela Husbands   AAD+ Amiodarone AND Sotalol   Past Medical History:  Diagnosis Date  . Arrhythmogenic right ventricular cardiomyopathy (HCC)    a. 04/2014 s/p ICD upgrade 2/2 EOL-->BSX E143 Engergen DR DC AICD.  Marland Kitchen ICD -[employer]   . Sinus node dysfunction (HCC)    a. 04/2014 Atrial lead added @ time of ICD upgrade.  . Ventricular tachycardia [facility])     Past Surgical History:  Procedure Laterality Date  . CARDIAC DEFIBRILLATOR PLACEMENT     Guidant - T175 Vitality  . IMPLANTABLE CARDIOVERTER DEFIBRILLATOR (ICD) GENERATOR CHANGE N/A 05/10/2014   Procedure: ICD GENERATOR CHANGE;  Surgeon: Marinus Maw, MD;  Location: [facility] CATH LAB;  Service: Cardiovascular;   Laterality: N/A;    Current Outpatient Medications  Medication Sig Dispense Refill  . amiodarone (PACERONE) 200 MG tablet Take 1 tablet (200 mg total) by mouth daily. Please keep upcoming appt in September with Dr. Graciela Husbands before anymore refills. Thank you 90 tablet 0  . aspirin 81 MG tablet Take 81 mg by mouth daily.    . methimazole (TAPAZOLE) 5 MG tablet Take 1 tablet (5 mg total) by mouth daily. 15 tablet 0  . Multiple Vitamin (MULTIVITAMIN) tablet Take 1 tablet by mouth daily.    . NON FORMULARY [facility]  Anti-fungal (nail)- #1    . sotalol (BETAPACE) 80 MG tablet Take 1 tablet (80 mg total) by mouth 2 (two) times daily. Please be sure to keep your appt in 10/2019 with appt with Dr. Graciela Husbands 60 tablet 3   No current facility-administered medications for this visit.    Allergies:   Lidocaine and Percocet [oxycodone-acetaminophen]   Social History:  The patient  reports that he has never smoked. He has never used smokeless tobacco. He reports that he does not drink alcohol and does not use drugs.   Family History:  The patient's family history includes Diabetes in his father and mother; Heart disease in his mother.  ROS:  Please see the history of present illness.    All other systems are reviewed and otherwise negative.   PHYSICAL EXAM:  VS:  There were no vitals taken for this visit. BMI: There is no height or weight on  file to calculate BMI. Well nourished, well developed, in no acute distress  HEENT: normocephalic, atraumatic  Neck: no JVD, carotid bruits or masses Cardiac:  RRR; no significant murmurs, no rubs, or gallops Lungs:  CTA b/l, no wheezing, rhonchi or rales  Abd: soft, nontender MS: no deformity or  atrophy Ext: no edema  Skin: warm and dry, no rash, he has a bluish discoloration to his checks/nose particularly Neuro:  No gross deficits appreciated Psych: euthymic mood, full affect  ICD site is stable, no tethering or discomfort   EKG:  Done today and  reviewed by myself shows  SB 50bpm, QT , QTc   ICD remote interrogation done today and reviewed by myself: Battery and lead measurements are good. RV threshold is 1.4/0.4 (same as implant) RV outputs changed to 2.8V/0.53ms, he VP <1% No arrhythmias   09/15/2018; TTE IMPRESSIONS  1. The left ventricle has low normal systolic function, with an ejection  fraction of 50-55%. The cavity size was normal. There is mild asymmetric  left ventricular hypertrophy. Left ventricular diastolic Doppler  parameters are consistent with impaired  relaxation.  2. The right ventricle has moderately reduced systolic function. The  cavity was moderately enlarged. There is no increase in right ventricular  wall thickness. Right ventricular systolic pressure is normal with an  estimated pressure of 23.6 mmHg.  3. Tricuspid valve regurgitation is mild-moderate.  4. When compared to the prior study: 05/09/14 - LVEF has improved  slightly. Otherwise no significant change in RV size or function, though  this was suboptimally visualized on the prior exam. Side by side  comparison of images performed.     Recent Labs: No results found for requested labs within last 8760 hours.  No results found for requested labs within last 8760 hours.   CrCl cannot be calculated (Patient's most recent lab result is older than the maximum 21 days allowed.).   Wt Readings from Last 3 Encounters:  07/11/18 144 lb (65.3 kg)  07/11/17 139 lb (63 kg)  03/27/16 135 lb (61.2 kg)     Other studies reviewed: Additional studies/records reviewed today include: summarized above  ASSESSMENT AND PLAN:  1. ARVD 2. VT  3. Sinus node dysfunction     w/ICD      longterm on amiodarone/sotalol Bluish discoloration of his face he says is not new and likely 2/2 to his amiodarone, and now perhaps some corneal deposits. Discussed with Dr. Graciela Husbands, knows him well. We will reduce his amiodarone to 200 mg daily 5 days a week  (none on 2 days) I will see him back in a few months to monitor his closely  BMET, Mag, LFTs and TSH today   Disposition:as above and every 3 mo remotes as well.   Current medicines are reviewed at length with the patient today.  The patient did not have any concerns regarding medicines.  Judith Blonder, PA-C 11/02/2019 7:52 AM     [facility] [address] [place] [address] [phone] (office)  [phone] (fax)

## 2019-11-03 ENCOUNTER — Other Ambulatory Visit: Payer: Self-pay

## 2019-11-03 ENCOUNTER — Ambulatory Visit (INDEPENDENT_AMBULATORY_CARE_PROVIDER_SITE_OTHER): Payer: BC Managed Care – PPO | Admitting: Physician Assistant

## 2019-11-03 VITALS — BP 140/92 | HR 50 | Ht 69.0 in | Wt 130.0 lb

## 2019-11-03 DIAGNOSIS — Z79899 Other long term (current) drug therapy: Secondary | ICD-10-CM | POA: Diagnosis not present

## 2019-11-03 DIAGNOSIS — Z5181 Encounter for therapeutic drug level monitoring: Secondary | ICD-10-CM

## 2019-11-03 DIAGNOSIS — I472 Ventricular tachycardia, unspecified: Secondary | ICD-10-CM

## 2019-11-03 DIAGNOSIS — I495 Sick sinus syndrome: Secondary | ICD-10-CM

## 2019-11-03 DIAGNOSIS — Z95810 Presence of automatic (implantable) cardiac defibrillator: Secondary | ICD-10-CM | POA: Diagnosis not present

## 2019-11-03 DIAGNOSIS — I428 Other cardiomyopathies: Secondary | ICD-10-CM

## 2019-11-03 NOTE — Patient Instructions (Signed)
Medication Instructions:   START TAKING AMIODARONE 200 MG ONCE A DAY FOR 5 DAYS A WEEK ONLY   *If you need a refill on your cardiac medications before your next appointment, please call your pharmacy*   Lab Work:  BMET MAG LFT AND TSH TODAY   If you have labs (blood work) drawn today and your tests are completely normal, you will receive your results only by: Marland Kitchen MyChart Message (if you have MyChart) OR . A paper copy in the mail If you have any lab test that is abnormal or we need to change your treatment, we will call you to review the results.   Testing/Procedures: NONE ORDERED  TODAY   Follow-Up: At [facility], you and your health needs are our priority.  As part of our continuing mission to provide you with exceptional heart care, we have created designated Provider Care Teams.  These Care Teams include your primary Cardiologist (physician) and Advanced Practice Providers (APPs -  Physician Assistants and Nurse Practitioners) who all work together to provide you with the care you need, when you need it.  We recommend signing up for the patient portal called "MyChart".  Sign up information is provided on this After Visit Summary.  MyChart is used to connect with patients for Virtual Visits (Telemedicine).  Patients are able to view lab/test results, encounter notes, upcoming appointments, etc.  Non-urgent messages can be sent to your provider as well.   To learn more about what you can do with MyChart, go to [email or web address].    Your next appointment:   1 year(s)  The format for your next appointment:   In Person  Provider:   You may see Dr. Marikay Alar  or one of the following Advanced Practice Providers on your designated Care Team:    Gypsy Balsam, NP  Francis Dowse, [place]  Casimiro Needle "Otilio Saber, [place]  Your physician wants you to follow-up in:  IN 3  MONTHS WITH Francis Dowse        Other Instructions

## 2019-11-04 LAB — BASIC METABOLIC PANEL
BUN/Creatinine Ratio: 10 (ref 9–20)
BUN: 9 mg/dL (ref 6–24)
CO2: 24 mmol/L (ref 20–29)
Calcium: 9 mg/dL (ref 8.7–10.2)
Chloride: 103 mmol/L (ref 96–106)
Creatinine, Ser: 0.92 mg/dL (ref 0.76–1.27)
GFR calc Af Amer: 107 mL/min/{1.73_m2} (ref >59)
GFR calc non Af Amer: 93 mL/min/{1.73_m2} (ref >59)
Glucose: 88 mg/dL (ref 65–99)
Potassium: 4.6 mmol/L (ref 3.5–5.2)
Sodium: 138 mmol/L (ref 134–144)

## 2019-11-04 LAB — HEPATIC FUNCTION PANEL
ALT: 9 IU/L (ref 0–44)
AST: 14 IU/L (ref 0–40)
Albumin: 4.7 g/dL (ref 3.8–4.9)
Alkaline Phosphatase: 83 IU/L (ref 48–121)
Bilirubin Total: 0.5 mg/dL (ref 0.0–1.2)
Bilirubin, Direct: 0.14 mg/dL (ref 0.00–0.40)
Total Protein: 6.4 g/dL (ref 6.0–8.5)

## 2019-11-04 LAB — MAGNESIUM: Magnesium: 2 mg/dL (ref 1.6–2.3)

## 2019-11-04 LAB — TSH: TSH: 1.78 u[IU]/mL (ref 0.450–4.500)

## 2019-11-04 NOTE — Progress Notes (Signed)
Lvm of normal results

## 2019-11-10 ENCOUNTER — Encounter: Payer: BC Managed Care – PPO | Admitting: Internal Medicine

## 2019-12-11 ENCOUNTER — Other Ambulatory Visit: Payer: Self-pay | Admitting: Internal Medicine

## 2019-12-16 ENCOUNTER — Other Ambulatory Visit: Payer: Self-pay

## 2019-12-16 ENCOUNTER — Telehealth: Payer: Self-pay | Admitting: Internal Medicine

## 2019-12-16 DIAGNOSIS — I472 Ventricular tachycardia, unspecified: Secondary | ICD-10-CM

## 2019-12-16 MED ORDER — SOTALOL HCL 80 MG PO TABS: 80.0000 mg | ORAL_TABLET | Freq: Two times a day (BID) | ORAL | 3 refills | Status: DC

## 2019-12-16 NOTE — Telephone Encounter (Signed)
*  STAT* If patient is at the pharmacy, call can be transferred to refill team.   1. Which medications need to be refilled? (please list name of each medication and dose if known) sotalol (BETAPACE) 80 MG tablet [[id number]]   2. Which pharmacy/location (including street and city if local pharmacy) is medication to be sent to? [id number] [place], [place] - [address] AT [facility] [address]  [address], [place] [address]  Phone:  [phone] Fax:  [phone]   3. Do they need a 30 day or 90 day supply? 90

## 2020-01-20 ENCOUNTER — Telehealth: Payer: Self-pay

## 2020-01-20 NOTE — Telephone Encounter (Signed)
Returned pt's call and left a message asking him to give our office a call back concerning the pharmacy that he needed his medication methimazole to be sent to.

## 2020-02-01 NOTE — Progress Notes (Deleted)
Cardiology Office Note Date:  02/01/2020  Patient ID:  Ernest Morgan 04-14-1965, MRN [id number] PCP:  Geoffry Paradise, MD  Cardiologist:  Dr. Graciela Husbands   Chief Complaint: *** planned f/u  History of Present Illness: Ernest Morgan is a 54 y.o. male with history of ARV dysplasia, VT (storm)  w/ICD on amiodarone and Sotaol, sinus node dysfunction, hyperthyroidism,   He comes today to be seen for Dr. Graciela Husbands, last seen by him may 2020.  He was doing well, planned to update his echo, labs TTE noted LVEF 50-55%, mild asymmetric  left ventricular hypertrophy, right ventricle has moderately reduced systolic function. The cavity was moderately enlarged.  RV was felt to be stable.   I saw him 11/03/2019 He is doing well.  No exertional incapacities.  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 No CP, palpitations. No near syncope or syncope. He saw his eye doctor who told him he had "cloudy appearance" behind his cornea and mentioned something about his amiodarone.  He says that early in the mornings he has a kind of blurred or cloudy vision that clears up after a few minutes He has not had any shocks He was noted to have bluish discoloration of his face, this apparently not new, d/w Dr. Graciela Husbands, ? Corneal deposits and skin changes, his amiodarone was reduced to 200mg  daily 5 days week/none on 2.  Planned to follow closely.  *** VT? *** symptoms *** vision?  F/u with opthalmology? *** labs are utd *** amio dose????  List still says daily   Device information BSCi dual chamber ICD, original implant single lead 2001, A lead added at gen change 05/10/14, Dr. 05/12/14   AAD+ Amiodarone AND Sotalol   Past Medical History:  Diagnosis Date  . Arrhythmogenic right ventricular cardiomyopathy (HCC)    a. 04/2014 s/p ICD upgrade 2/2 EOL-->BSX E143 Engergen DR DC AICD.  05/2014 ICD -Marland Kitchen   . Sinus node dysfunction (HCC)    a. 04/2014 Atrial lead added @ time  of ICD upgrade.  . Ventricular tachycardia [facility])     Past Surgical History:  Procedure Laterality Date  . CARDIAC DEFIBRILLATOR PLACEMENT     Guidant - T175 Vitality  . IMPLANTABLE CARDIOVERTER DEFIBRILLATOR (ICD) GENERATOR CHANGE N/A 05/10/2014   Procedure: ICD GENERATOR CHANGE;  Surgeon: 05/12/2014, MD;  Location: [facility] CATH LAB;  Service: Cardiovascular;  Laterality: N/A;    Current Outpatient Medications  Medication Sig Dispense Refill  . amiodarone (PACERONE) 200 MG tablet TAKE 1 TABLET BY MOUTH EVERY DAY 90 tablet 3  . aspirin 81 MG tablet Take 81 mg by mouth daily.    . methimazole (TAPAZOLE) 5 MG tablet Take 1 tablet (5 mg total) by mouth daily. 15 tablet 0  . Multiple Vitamin (MULTIVITAMIN) tablet Take 1 tablet by mouth daily.    . NON FORMULARY [facility]  Anti-fungal (nail)- #1    . sotalol (BETAPACE) 80 MG tablet Take 1 tablet (80 mg total) by mouth 2 (two) times daily. 180 tablet 3   No current facility-administered medications for this visit.    Allergies:   Lidocaine and Percocet [oxycodone-acetaminophen]   Social History:  The patient  reports that he has never smoked. He has never used smokeless tobacco. He reports that he does not drink alcohol and does not use drugs.   Family History:  The patient's family history includes Diabetes in his father and mother; Heart disease in his mother.  ROS:  Please  see the history of present illness.    All other systems are reviewed and otherwise negative.   PHYSICAL EXAM:  VS:  There were no vitals taken for this visit. BMI: There is no height or weight on file to calculate BMI. Well nourished, well developed, in no acute distress  HEENT: normocephalic, atraumatic  Neck: no JVD, carotid bruits or masses Cardiac:  *** RRR; no significant murmurs, no rubs, or gallops Lungs:  *** CTA b/l, no wheezing, rhonchi or rales  Abd: soft, nontender MS: no deformity or *** atrophy Ext: *** no edema  Skin: warm and dry,  no rash, he has a bluish discoloration to his checks/nose particularly Neuro:  No gross deficits appreciated Psych: euthymic mood, full affect  *** ICD site is stable, no tethering or discomfort   EKG:  Done today and reviewed by myself shows  *** 11/03/2019: SB 50bpm, QT , QTc   ICD interrogation done today and reviewed by myself: ***  09/15/2018; TTE IMPRESSIONS  1. The left ventricle has low normal systolic function, with an ejection  fraction of 50-55%. The cavity size was normal. There is mild asymmetric  left ventricular hypertrophy. Left ventricular diastolic Doppler  parameters are consistent with impaired  relaxation.  2. The right ventricle has moderately reduced systolic function. The  cavity was moderately enlarged. There is no increase in right ventricular  wall thickness. Right ventricular systolic pressure is normal with an  estimated pressure of 23.6 mmHg.  3. Tricuspid valve regurgitation is mild-moderate.  4. When compared to the prior study: 05/09/14 - LVEF has improved  slightly. Otherwise no significant change in RV size or function, though  this was suboptimally visualized on the prior exam. Side by side  comparison of images performed.     Recent Labs: 11/03/2019: ALT 9; BUN 9; Creatinine, Ser 0.92; Magnesium 2.0; Potassium 4.6; Sodium 138; TSH 1.780  No results found for requested labs within last 8760 hours.   CrCl cannot be calculated (Patient's most recent lab result is older than the maximum 21 days allowed.).   Wt Readings from Last 3 Encounters:  11/03/19 130 lb (59 kg)  07/11/18 144 lb (65.3 kg)  07/11/17 139 lb (63 kg)     Other studies reviewed: Additional studies/records reviewed today include: summarized above  ASSESSMENT AND PLAN:  1. ARVD 2. VT  3. Sinus node dysfunction     w/ICD     On sotalol and amiodarone      Labs up to date     ***   Disposition: ***    Current medicines are reviewed at length with the  patient today.  The patient did not have any concerns regarding medicines.  Judith Blonder, PA-C 02/01/2020 8:07 PM     [facility] [address] [place] [address] [phone] (office)  [phone] (fax)

## 2020-02-03 ENCOUNTER — Encounter: Payer: Self-pay | Admitting: Physician Assistant

## 2020-03-04 NOTE — Progress Notes (Deleted)
Cardiology Office Note Date:  03/04/2020  Patient ID:  [place], [place] [birth year], MRN [id number] PCP:  Geoffry Paradise, MD  Cardiologist:  Dr. Graciela Husbands   Chief Complaint: *** planned f/u  History of Present Illness: Ernest Morgan is a 55 y.o. male with history of ARV dysplasia, VT (storm)  w/ICD on amiodarone and Sotaol, sinus node dysfunction, hyperthyroidism,   He comes today to be seen for Dr. Graciela Husbands, last seen by him may 2020.  He was doing well, planned to update his echo, labs TTE noted LVEF 50-55%, mild asymmetric  left ventricular hypertrophy, right ventricle has moderately reduced systolic function. The cavity was moderately enlarged.  RV was felt to be stable.   I saw him 11/03/2019 He is doing well.  No exertional incapacities.  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 No CP, palpitations. No near syncope or syncope. He saw his eye doctor who told him he had "cloudy appearance" behind his cornea and mentioned something about his amiodarone.  He says that early in the mornings he has a kind of blurred or cloudy vision that clears up after a few minutes He has not had any shocks He was noted to have bluish discoloration of his face, this apparently not new, d/w Dr. Graciela Husbands, ? Corneal deposits and skin changes, his amiodarone was reduced to 200mg  daily 5 days week/none on 2.  Planned to follow closely.  *** VT? *** symptoms *** vision?  F/u with opthalmology? *** labs are utd *** amio dose????  List still says daily   Device information BSCi dual chamber ICD, original implant single lead 2001, A lead added at gen change 05/10/14, Dr. 05/12/14   AAD+ Amiodarone AND Sotalol   Past Medical History:  Diagnosis Date  . Arrhythmogenic right ventricular cardiomyopathy (HCC)    a. 04/2014 s/p ICD upgrade 2/2 EOL-->BSX E143 Engergen DR DC AICD.  05/2014 ICD -Marland Kitchen   . Sinus node dysfunction (HCC)    a. 04/2014 Atrial lead added @ time of  ICD upgrade.  . Ventricular tachycardia [facility])     Past Surgical History:  Procedure Laterality Date  . CARDIAC DEFIBRILLATOR PLACEMENT     Guidant - T175 Vitality  . IMPLANTABLE CARDIOVERTER DEFIBRILLATOR (ICD) GENERATOR CHANGE N/A 05/10/2014   Procedure: ICD GENERATOR CHANGE;  Surgeon: 05/12/2014, MD;  Location: [facility] CATH LAB;  Service: Cardiovascular;  Laterality: N/A;    Current Outpatient Medications  Medication Sig Dispense Refill  . amiodarone (PACERONE) 200 MG tablet TAKE 1 TABLET BY MOUTH EVERY DAY 90 tablet 3  . aspirin 81 MG tablet Take 81 mg by mouth daily.    . methimazole (TAPAZOLE) 5 MG tablet Take 1 tablet (5 mg total) by mouth daily. 15 tablet 0  . Multiple Vitamin (MULTIVITAMIN) tablet Take 1 tablet by mouth daily.    . NON FORMULARY [facility]  Anti-fungal (nail)- #1    . sotalol (BETAPACE) 80 MG tablet Take 1 tablet (80 mg total) by mouth 2 (two) times daily. 180 tablet 3   No current facility-administered medications for this visit.    Allergies:   Lidocaine and Percocet [oxycodone-acetaminophen]   Social History:  The patient  reports that he has never smoked. He has never used smokeless tobacco. He reports that he does not drink alcohol and does not use drugs.   Family History:  The patient's family history includes Diabetes in his father and mother; Heart disease in his mother.  ROS:  Please  see the history of present illness.    All other systems are reviewed and otherwise negative.   PHYSICAL EXAM:  VS:  There were no vitals taken for this visit. BMI: There is no height or weight on file to calculate BMI. Well nourished, well developed, in no acute distress  HEENT: normocephalic, atraumatic  Neck: no JVD, carotid bruits or masses Cardiac:  *** RRR; no significant murmurs, no rubs, or gallops Lungs:  *** CTA b/l, no wheezing, rhonchi or rales  Abd: soft, nontender MS: no deformity or *** atrophy Ext: *** no edema  Skin: warm and dry, no  rash, he has a bluish discoloration to his checks/nose particularly Neuro:  No gross deficits appreciated Psych: euthymic mood, full affect  *** ICD site is stable, no tethering or discomfort   EKG:  Done today and reviewed by myself shows  *** 11/03/2019: SB 50bpm, QT , QTc   ICD interrogation done today and reviewed by myself: ***  09/15/2018; TTE IMPRESSIONS  1. The left ventricle has low normal systolic function, with an ejection  fraction of 50-55%. The cavity size was normal. There is mild asymmetric  left ventricular hypertrophy. Left ventricular diastolic Doppler  parameters are consistent with impaired  relaxation.  2. The right ventricle has moderately reduced systolic function. The  cavity was moderately enlarged. There is no increase in right ventricular  wall thickness. Right ventricular systolic pressure is normal with an  estimated pressure of 23.6 mmHg.  3. Tricuspid valve regurgitation is mild-moderate.  4. When compared to the prior study: 05/09/14 - LVEF has improved  slightly. Otherwise no significant change in RV size or function, though  this was suboptimally visualized on the prior exam. Side by side  comparison of images performed.     Recent Labs: 11/03/2019: ALT 9; BUN 9; Creatinine, Ser 0.92; Magnesium 2.0; Potassium 4.6; Sodium 138; TSH 1.780  No results found for requested labs within last 8760 hours.   CrCl cannot be calculated (Patient's most recent lab result is older than the maximum 21 days allowed.).   Wt Readings from Last 3 Encounters:  11/03/19 130 lb (59 kg)  07/11/18 144 lb (65.3 kg)  07/11/17 139 lb (63 kg)     Other studies reviewed: Additional studies/records reviewed today include: summarized above  ASSESSMENT AND PLAN:  1. ARVD 2. VT  3. Sinus node dysfunction     w/ICD     On sotalol and amiodarone      Labs up to date     ***   Disposition: ***    Current medicines are reviewed at length with the  patient today.  The patient did not have any concerns regarding medicines.  Judith Blonder, PA-C 03/04/2020 10:03 AM     [facility] [address] [place] [address] [phone] (office)  [phone] (fax)

## 2020-03-07 ENCOUNTER — Encounter: Payer: Self-pay | Admitting: Physician Assistant

## 2020-07-11 ENCOUNTER — Ambulatory Visit (INDEPENDENT_AMBULATORY_CARE_PROVIDER_SITE_OTHER): Payer: Self-pay

## 2020-07-11 DIAGNOSIS — I472 Ventricular tachycardia, unspecified: Secondary | ICD-10-CM

## 2020-07-13 LAB — CUP PACEART REMOTE DEVICE CHECK
Battery Remaining Longevity: 72 mo
Battery Remaining Percentage: 85 %
Brady Statistic RA Percent Paced: 15 %
Brady Statistic RV Percent Paced: 0 %
Date Time Interrogation Session: 20220517182800
HighPow Impedance: 50 Ohm
Implantable Lead Implant Date: 20011011
Implantable Lead Implant Date: 20160314
Implantable Lead Location: 753859
Implantable Lead Location: 753860
Implantable Lead Model: 148
Implantable Lead Model: 5076
Implantable Lead Serial Number: 111569
Implantable Pulse Generator Implant Date: 20160314
Lead Channel Impedance Value: 656 Ohm
Lead Channel Impedance Value: 806 Ohm
Lead Channel Pacing Threshold Amplitude: 0.9 V
Lead Channel Pacing Threshold Amplitude: 1.4 V
Lead Channel Pacing Threshold Pulse Width: 0.4 ms
Lead Channel Pacing Threshold Pulse Width: 0.4 ms
Lead Channel Setting Pacing Amplitude: 2.4 V
Lead Channel Setting Pacing Amplitude: 2.8 V
Lead Channel Setting Pacing Pulse Width: 0.4 ms
Lead Channel Setting Sensing Sensitivity: 0.6 mV
Pulse Gen Serial Number: 112736

## 2020-07-21 ENCOUNTER — Encounter: Payer: Self-pay | Admitting: Student

## 2020-08-03 NOTE — Progress Notes (Signed)
Remote ICD transmission.   

## 2020-08-08 ENCOUNTER — Telehealth: Payer: Self-pay

## 2020-08-08 NOTE — Telephone Encounter (Signed)
Patient returned call to office. Patient is scheduled to see Francis Dowse 6/29 at 8:15 am

## 2020-08-08 NOTE — Telephone Encounter (Signed)
-----   Message from Duke Salvia, MD sent at 08/06/2020  9:23 AM EDT ----- Remote reviewed. This remote is abnormal for  LOW R waves He NEEDS to com e in to see thx

## 2020-08-08 NOTE — Telephone Encounter (Signed)
Contacted patient to make in-clinic apt. With Mardelle Matte or [place]. 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 Device Clinic phone number provided.

## 2020-08-23 NOTE — Progress Notes (Signed)
Cardiology Office Note Date:  08/24/2020  Patient ID:  Ernest Morgan, Ernest Morgan [birth year], MRN [id number] PCP:  Geoffry Paradise, MD  Cardiologist:  Dr. Graciela Husbands   Chief Complaint: low R waves  History of Present Illness: Ernest Morgan is a 55 y.o. male with history of ARV dysplasia, VT (storm)  w/ICD on amiodarone and Sotaol, sinus node dysfunction, hyperthyroidism,   He comes today to be seen for Dr. Graciela Husbands, last seen by him may 2020.  He was doing well, planned to update his echo, labs TTE noted LVEF 50-55%, mild asymmetric  left ventricular hypertrophy, right ventricle has moderately reduced systolic function. The cavity was moderately enlarged.  RV was felt to be stable.  I saw him 11/03/2019 He is doing well.  No exertional incapacities.  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 No CP, palpitations. No near syncope or syncope. He saw his eye doctor who told him he had "cloudy appearance" behind his cornea and mentioned something about his amiodarone.  He says that early in the mornings he has a kind of blurred or cloudy vision that clears up after a few minutes He has not had any shocks  Last remote noted R waves 2.4 and was called to come in for an in clinic device check  TODAY He c/w his eye MD with intermittent "hazy" vision that is transient and occasional, he said there is a haziness in/on his corneas, again they discussed the amiodarone, and recommended that he discuss with Dr. Graciela Husbands. He sees his PMD this morning as well for a annual visit and labs He is feeling well, no CP, palpitations or cardiac awareness.  He and his wife get out an walk for exercise and denies any exertional incapacities, his work keeps him moving as well. No dizzy spells, near syncope or syncope, no shocks.   Device information BSCi dual chamber ICD, original implant single lead 2001, A lead added at gen change 05/10/14, Dr. Graciela Husbands  Chronically low R wave  amplitude AAD+ Amiodarone AND Sotalol   Past Medical History:  Diagnosis Date   Arrhythmogenic right ventricular cardiomyopathy (HCC)    a. 04/2014 s/p ICD upgrade 2/2 EOL-->BSX E143 Engergen DR DC AICD.   ICD -[employer]    Sinus node dysfunction (HCC)    a. 04/2014 Atrial lead added @ time of ICD upgrade.   Ventricular tachycardia [facility])     Past Surgical History:  Procedure Laterality Date   CARDIAC DEFIBRILLATOR PLACEMENT     Guidant - T175 Vitality   IMPLANTABLE CARDIOVERTER DEFIBRILLATOR (ICD) GENERATOR CHANGE N/A 05/10/2014   Procedure: ICD GENERATOR CHANGE;  Surgeon: Marinus Maw, MD;  Location: [facility] CATH LAB;  Service: Cardiovascular;  Laterality: N/A;    Current Outpatient Medications  Medication Sig Dispense Refill   amiodarone (PACERONE) 200 MG tablet TAKE 1 TABLET BY MOUTH EVERY DAY 90 tablet 3   aspirin 81 MG tablet Take 81 mg by mouth daily.     methimazole (TAPAZOLE) 5 MG tablet Take 1 tablet (5 mg total) by mouth daily. 15 tablet 0   Multiple Vitamin (MULTIVITAMIN) tablet Take 1 tablet by mouth daily.     NON FORMULARY [facility]  Anti-fungal (nail)- #1     sotalol (BETAPACE) 80 MG tablet Take 1 tablet (80 mg total) by mouth 2 (two) times daily. 180 tablet 3   No current facility-administered medications for this visit.    Allergies:   Lidocaine and Percocet [oxycodone-acetaminophen]   Social History:  The patient  reports that he has never smoked. He has never used smokeless tobacco. He reports that he does not drink alcohol and does not use drugs.   Family History:  The patient's family history includes Diabetes in his father and mother; Heart disease in his mother.  ROS:  Please see the history of present illness.    All other systems are reviewed and otherwise negative.   PHYSICAL EXAM:  VS:  BP (!) 142/70   Pulse (!) 49   Ht 5\' 9"  (1.753 m)   Wt 132 lb (59.9 kg)   SpO2 98%   BMI 19.49 kg/m  BMI: Body mass index is 19.49  kg/m. Well nourished, well developed, in no acute distress  HEENT: normocephalic, atraumatic  Neck: no JVD, carotid bruits or masses Cardiac:  RRR; no significant murmurs, no rubs, or gallops Lungs:  CTA b/l, no wheezing, rhonchi or rales  Abd: soft, nontender MS: no deformity or  atrophy Ext: no edema  Skin: warm and dry, no rash, he has a bluish discoloration to his checks/nose particularly Neuro:  No gross deficits appreciated Psych: euthymic mood, full affect  ICD site is stable, no tethering or discomfort   EKG:  done today and reviewed by myself: SB 49bpm, QT/QTc stable, unT changes V1-3 unchanged from prior, low amplitude limb leads also unchanged going back 4 years   ICD remote interrogation done today and reviewed by myself: Battery and lead measurements are stable R waves today 2.4 In review of his last year of R waves, measuring 2-3's, infrequently to 23mV RV lead impedance has been stable No noise notes   09/15/2018; TTE IMPRESSIONS  1. The left ventricle has low normal systolic function, with an ejection  fraction of 50-55%. The cavity size was normal. There is mild asymmetric  left ventricular hypertrophy. Left ventricular diastolic Doppler  parameters are consistent with impaired  relaxation.   2. The right ventricle has moderately reduced systolic function. The  cavity was moderately enlarged. There is no increase in right ventricular  wall thickness. Right ventricular systolic pressure is normal with an  estimated pressure of 23.6 mmHg.   3. Tricuspid valve regurgitation is mild-moderate.   4. When compared to the prior study: 05/09/14 - LVEF has improved  slightly. Otherwise no significant change in RV size or function, though  this was suboptimally visualized on the prior exam. Side by side  comparison of images performed.     Recent Labs: 11/03/2019: ALT 9; BUN 9; Creatinine, Ser 0.92; Magnesium 2.0; Potassium 4.6; Sodium 138; TSH 1.780  No results  found for requested labs within last 8760 hours.   CrCl cannot be calculated (Patient's most recent lab result is older than the maximum 21 days allowed.).   Wt Readings from Last 3 Encounters:  08/24/20 132 lb (59.9 kg)  11/03/19 130 lb (59 kg)  07/11/18 144 lb (65.3 kg)     Other studies reviewed: Additional studies/records reviewed today include: summarized above  ASSESSMENT AND PLAN:  1. ARVD 2. VT  3. Sinus node dysfunction     w/ICD  Amiodarone/sotalol QT/QTc stable      No arrhythmias Low R wave amplitude appears chronic in looking back in remotes as well back to 2020 Sensitivity is set appropriately no FF has been noted  I will send a message to dr. 07/13/18 regarding opthamology concerns regarding his amiodarone Amio labs were done in the fall and ok, no SOB, cough. He has labs today with his PMD  Disposition: back in 69mo in clinic, remotes every 45mo as usual, sooner if needed   Current medicines are reviewed at length with the patient today.  The patient did not have any concerns regarding medicines.  Judith Blonder, PA-C 08/24/2020 8:48 AM     [facility] [address] [place] [address] [phone] (office)  [phone] (fax)

## 2020-08-24 ENCOUNTER — Encounter: Payer: Self-pay | Admitting: Physician Assistant

## 2020-08-24 ENCOUNTER — Ambulatory Visit (INDEPENDENT_AMBULATORY_CARE_PROVIDER_SITE_OTHER): Payer: Self-pay | Admitting: Physician Assistant

## 2020-08-24 VITALS — BP 142/70 | HR 49 | Ht 69.0 in | Wt 132.0 lb

## 2020-08-24 DIAGNOSIS — Z79899 Other long term (current) drug therapy: Secondary | ICD-10-CM

## 2020-08-24 DIAGNOSIS — I428 Other cardiomyopathies: Secondary | ICD-10-CM

## 2020-08-24 DIAGNOSIS — I472 Ventricular tachycardia, unspecified: Secondary | ICD-10-CM

## 2020-08-24 DIAGNOSIS — Z95810 Presence of automatic (implantable) cardiac defibrillator: Secondary | ICD-10-CM

## 2020-08-24 DIAGNOSIS — Z5181 Encounter for therapeutic drug level monitoring: Secondary | ICD-10-CM

## 2020-08-24 LAB — CUP PACEART INCLINIC DEVICE CHECK
Date Time Interrogation Session: 20220629133420
Implantable Lead Implant Date: 20011011
Implantable Lead Implant Date: 20160314
Implantable Lead Location: 753859
Implantable Lead Location: 753860
Implantable Lead Model: 148
Implantable Lead Model: 5076
Implantable Lead Serial Number: 111569
Implantable Pulse Generator Implant Date: 20160314
Lead Channel Pacing Threshold Amplitude: 0.8 V
Lead Channel Pacing Threshold Amplitude: 1.1 V
Lead Channel Pacing Threshold Pulse Width: 0.4 ms
Lead Channel Pacing Threshold Pulse Width: 0.4 ms
Lead Channel Sensing Intrinsic Amplitude: 2.4 mV
Lead Channel Sensing Intrinsic Amplitude: 5.7 mV
Pulse Gen Serial Number: 112736

## 2020-08-24 MED ORDER — AMIODARONE HCL 200 MG PO TABS: 200.0000 mg | ORAL_TABLET | Freq: Every day | ORAL | 3 refills | Status: DC

## 2020-08-24 NOTE — Patient Instructions (Signed)
Medication Instructions:   Your physician recommends that you continue on your current medications as directed. Please refer to the Current Medication list given to you today.  *If you need a refill on your cardiac medications before your next appointment, please call your pharmacy*   Lab Work: NONE ORDERED  TODAY   If you have labs (blood work) drawn today and your tests are completely normal, you will receive your results only by: . MyChart Message (if you have MyChart) OR . A paper copy in the mail If you have any lab test that is abnormal or we need to change your treatment, we will call you to review the results.   Testing/Procedures: NONE ORDERED  TODAY   Follow-Up: At [facility], you and your health needs are our priority.  As part of our continuing mission to provide you with exceptional heart care, we have created designated Provider Care Teams.  These Care Teams include your primary Cardiologist (physician) and Advanced Practice Providers (APPs -  Physician Assistants and Nurse Practitioners) who all work together to provide you with the care you need, when you need it.  We recommend signing up for the patient portal called "MyChart".  Sign up information is provided on this After Visit Summary.  MyChart is used to connect with patients for Virtual Visits (Telemedicine).  Patients are able to view lab/test results, encounter notes, upcoming appointments, etc.  Non-urgent messages can be sent to your provider as well.   To learn more about what you can do with MyChart, go to [email or web address].    Your next appointment:   6 month(s)  The format for your next appointment:   In Person  Provider:   You may see Dr. Klein  or one of the following Advanced Practice Providers on your designated Care Team:    Amber Seiler, NP  Renee Ursuy, PA-C  Michael "Andy" Tillery, PA-C    Other Instructions   

## 2020-08-24 NOTE — Addendum Note (Signed)
Addended by: Oleta Mouse on: 08/24/2020 11:22 AM   Modules accepted: Orders

## 2021-01-07 ENCOUNTER — Other Ambulatory Visit: Payer: Self-pay | Admitting: Internal Medicine

## 2021-01-07 DIAGNOSIS — I472 Ventricular tachycardia, unspecified: Secondary | ICD-10-CM

## 2021-01-09 ENCOUNTER — Other Ambulatory Visit: Payer: Self-pay | Admitting: *Deleted

## 2021-01-09 DIAGNOSIS — I472 Ventricular tachycardia, unspecified: Secondary | ICD-10-CM

## 2021-01-09 MED ORDER — SOTALOL HCL 80 MG PO TABS: 80.0000 mg | ORAL_TABLET | Freq: Two times a day (BID) | ORAL | 3 refills | Status: DC

## 2021-04-10 ENCOUNTER — Ambulatory Visit: Payer: Self-pay

## 2021-04-26 ENCOUNTER — Telehealth: Payer: Self-pay | Admitting: Internal Medicine

## 2021-04-26 NOTE — Telephone Encounter (Signed)
Attempted to return patient phone call, no answer.   ? ?Last transmission received in May 2022.  Left detailed message for patient to call [facility] [employer] for tech support with his monitor.   ?

## 2021-04-26 NOTE — Telephone Encounter (Signed)
? ? ?  1. Has your device fired?  ? ?2. Is you device beeping?  ? ?3. Are you experiencing draining or swelling at device site?  ? ?4. Are you calling to see if we received your device transmission? Yes, pt said he tried sending it yesterday 4-5 times but his monitor is not responding and not sure if we got his transmission ? ?5. Have you passed out?  ? ? ? ?Please route to Device Clinic Pool  ?

## 2021-05-09 LAB — CUP PACEART REMOTE DEVICE CHECK
Battery Remaining Longevity: 60 mo
Battery Remaining Percentage: 73 %
Brady Statistic RA Percent Paced: 13 %
Brady Statistic RV Percent Paced: 0 %
Date Time Interrogation Session: 20230215181100
HighPow Impedance: 55 Ohm
Implantable Lead Implant Date: 20011011
Implantable Lead Implant Date: 20160314
Implantable Lead Location: 753859
Implantable Lead Location: 753860
Implantable Lead Model: 148
Implantable Lead Model: 5076
Implantable Lead Serial Number: 111569
Implantable Pulse Generator Implant Date: 20160314
Lead Channel Impedance Value: 762 Ohm
Lead Channel Impedance Value: 826 Ohm
Lead Channel Pacing Threshold Amplitude: 0.8 V
Lead Channel Pacing Threshold Amplitude: 1.1 V
Lead Channel Pacing Threshold Pulse Width: 0.4 ms
Lead Channel Pacing Threshold Pulse Width: 0.4 ms
Lead Channel Setting Pacing Amplitude: 2.4 V
Lead Channel Setting Pacing Amplitude: 2.8 V
Lead Channel Setting Pacing Pulse Width: 0.4 ms
Lead Channel Setting Sensing Sensitivity: 0.6 mV
Pulse Gen Serial Number: 112736

## 2021-09-15 ENCOUNTER — Other Ambulatory Visit: Payer: Self-pay | Admitting: Physician Assistant

## 2021-09-25 NOTE — Progress Notes (Unsigned)
Electrophysiology Office Note Date: 09/28/2021  ID:  Ernest Morgan, DOB 1966-01-03, MRN [id number]  PCP: Geoffry Paradise, MD Primary Cardiologist: None Electrophysiologist: Sherryl Manges, MD   CC: Routine ICD follow-up  Ernest Morgan is a 56 y.o. male seen today for Sherryl Manges, MD for routine electrophysiology followup.  Since last being seen in our clinic the patient reports doing well from a cardiac perspective.  he denies chest pain, palpitations, dyspnea, PND, orthopnea, nausea, vomiting, dizziness, syncope, edema, weight gain, or early satiety. He has not had ICD shocks.   Device History: BSCi dual chamber ICD, original implant single lead 2001, A lead added at gen change 05/10/14, Dr. Graciela Husbands  Chronically low R wave amplitude AAD+ Amiodarone AND Sotalol  Past Medical History:  Diagnosis Date   Arrhythmogenic right ventricular cardiomyopathy (HCC)    a. 04/2014 s/p ICD upgrade 2/2 EOL-->BSX E143 Engergen DR DC AICD.   ICD -[employer]    Sinus node dysfunction (HCC)    a. 04/2014 Atrial lead added @ time of ICD upgrade.   Ventricular tachycardia [facility])    Past Surgical History:  Procedure Laterality Date   CARDIAC DEFIBRILLATOR PLACEMENT     Guidant - T175 Vitality   IMPLANTABLE CARDIOVERTER DEFIBRILLATOR (ICD) GENERATOR CHANGE N/A 05/10/2014   Procedure: ICD GENERATOR CHANGE;  Surgeon: Marinus Maw, MD;  Location: [facility] CATH LAB;  Service: Cardiovascular;  Laterality: N/A;    Current Outpatient Medications  Medication Sig Dispense Refill   amiodarone (PACERONE) 200 MG tablet Take 1 tablet by mouth once daily 30 tablet 0   aspirin 81 MG tablet Take 81 mg by mouth daily.     methimazole (TAPAZOLE) 5 MG tablet Take 1 tablet (5 mg total) by mouth daily. 15 tablet 0   Multiple Vitamin (MULTIVITAMIN) tablet Take 1 tablet by mouth daily.     NON FORMULARY [facility]  Anti-fungal (nail)- #1     sotalol (BETAPACE) 80 MG tablet Take 1 tablet (80 mg total) by mouth 2  (two) times daily. 180 tablet 3   traZODone (DESYREL) 50 MG tablet Take 50 mg by mouth at bedtime as needed.     No current facility-administered medications for this visit.    Allergies:   Lidocaine and Percocet [oxycodone-acetaminophen]   Social History: Social History   Socioeconomic History   Marital status: Married    Spouse name: Not on file   Number of children: Not on file   Years of education: Not on file   Highest education level: Not on file  Occupational History   Not on file  Tobacco Use   Smoking status: Never   Smokeless tobacco: Never  Substance and Sexual Activity   Alcohol use: No   Drug use: No   Sexual activity: Yes  Other Topics Concern   Not on file  Social History Narrative   Not on file   Social Determinants of Health   Financial Resource Strain: Not on file  Food Insecurity: Not on file  Transportation Needs: Not on file  Physical Activity: Not on file  Stress: Not on file  Social Connections: Not on file  Intimate Partner Violence: Not on file    Family History: Family History  Problem Relation Age of Onset   Heart disease Mother    Diabetes Mother    Diabetes Father     Review of Systems: All other systems reviewed and are otherwise negative except as noted above.   Physical Exam: Vitals:   09/28/21 5631  BP: (!) 142/88  Pulse: (!) 50  SpO2: 98%  Weight: 134 lb 6.4 oz (61 kg)  Height: 5\' 9"  (1.753 m)     GEN- The patient is well appearing, alert and oriented x 3 today.   HEENT: normocephalic, atraumatic; sclera clear, conjunctiva pink; hearing intact; oropharynx clear; neck supple, no JVP Lymph- no cervical lymphadenopathy Lungs- Clear to ausculation bilaterally, normal work of breathing.  No wheezes, rales, rhonchi Heart- Regular rate and rhythm, no murmurs, rubs or gallops, PMI not laterally displaced GI- soft, non-tender, non-distended, bowel sounds present, no hepatosplenomegaly Extremities- no clubbing or cyanosis.  No edema; DP/PT/radial pulses 2+ bilaterally MS- no significant deformity or atrophy Skin- warm and dry, no rash or lesion; ICD pocket well healed Psych- euthymic mood, full affect Neuro- strength and sensation are intact  ICD interrogation- reviewed in detail today,  See PACEART report  EKG:  EKG is ordered today. Personal review of EKG ordered today shows A paced VS at 50 bpm  Recent Labs: No results found for requested labs within last 365 days.   Wt Readings from Last 3 Encounters:  09/28/21 134 lb 6.4 oz (61 kg)  08/24/20 132 lb (59.9 kg)  11/03/19 130 lb (59 kg)     Other studies Reviewed: Additional studies/ records that were reviewed today include: Previous EP office notes.   Assessment and Plan:  1.  ARVD s/p 01/03/20 dual chamber ICD  2. H/o VT 3. H/o SND euvolemic today Stable on an appropriate medical regimen Normal ICD function See Pace Art report Pulse width adjusted to 0.6 ms to maintain 2x safety margin with threshold of 1.4V @ 0.6 ms (1.6V @ 0.73ms), though minimal V pacing.   EKG today shows stable QT on sotalol + amiodarone  Current medicines are reviewed at length with the patient today.    Labs/ tests ordered today include:  Orders Placed This Encounter  Procedures   Comprehensive metabolic panel   TSH   Magnesium   T4, free   EKG 12-Lead     Disposition:   Follow up with Dr. 11m in 6 months    Signed, Graciela Husbands, PA-C  09/28/2021 8:51 AM  [facility] [address] [place] [phone] (office) [phone] (fax)

## 2021-09-28 ENCOUNTER — Ambulatory Visit (INDEPENDENT_AMBULATORY_CARE_PROVIDER_SITE_OTHER): Payer: BLUE CROSS/BLUE SHIELD | Admitting: Student

## 2021-09-28 ENCOUNTER — Encounter: Payer: Self-pay | Admitting: Student

## 2021-09-28 VITALS — BP 142/88 | HR 50 | Ht 69.0 in | Wt 134.4 lb

## 2021-09-28 DIAGNOSIS — I495 Sick sinus syndrome: Secondary | ICD-10-CM

## 2021-09-28 DIAGNOSIS — I428 Other cardiomyopathies: Secondary | ICD-10-CM | POA: Diagnosis not present

## 2021-09-28 DIAGNOSIS — Z95810 Presence of automatic (implantable) cardiac defibrillator: Secondary | ICD-10-CM | POA: Diagnosis not present

## 2021-09-28 DIAGNOSIS — I472 Ventricular tachycardia, unspecified: Secondary | ICD-10-CM

## 2021-09-28 LAB — COMPREHENSIVE METABOLIC PANEL
ALT: 11 IU/L (ref 0–44)
AST: 16 IU/L (ref 0–40)
Albumin/Globulin Ratio: 2.5 — ABNORMAL HIGH (ref 1.2–2.2)
Albumin: 4.5 g/dL (ref 3.8–4.9)
Alkaline Phosphatase: 91 IU/L (ref 44–121)
BUN/Creatinine Ratio: 14 (ref 9–20)
BUN: 14 mg/dL (ref 6–24)
Bilirubin Total: 0.4 mg/dL (ref 0.0–1.2)
CO2: 23 mmol/L (ref 20–29)
Calcium: 9.4 mg/dL (ref 8.7–10.2)
Chloride: 103 mmol/L (ref 96–106)
Creatinine, Ser: 1 mg/dL (ref 0.76–1.27)
Globulin, Total: 1.8 g/dL (ref 1.5–4.5)
Glucose: 103 mg/dL — ABNORMAL HIGH (ref 70–99)
Potassium: 5.1 mmol/L (ref 3.5–5.2)
Sodium: 137 mmol/L (ref 134–144)
Total Protein: 6.3 g/dL (ref 6.0–8.5)
eGFR: 87 mL/min/{1.73_m2} (ref >59)

## 2021-09-28 LAB — CUP PACEART INCLINIC DEVICE CHECK
Date Time Interrogation Session: 20230803091629
HighPow Impedance: 33 Ohm
HighPow Impedance: 53 Ohm
Implantable Lead Implant Date: 20011011
Implantable Lead Implant Date: 20160314
Implantable Lead Location: 753859
Implantable Lead Location: 753860
Implantable Lead Model: 148
Implantable Lead Model: 5076
Implantable Lead Serial Number: 111569
Implantable Pulse Generator Implant Date: 20160314
Lead Channel Impedance Value: 690 Ohm
Lead Channel Impedance Value: 806 Ohm
Lead Channel Pacing Threshold Amplitude: 0.8 V
Lead Channel Pacing Threshold Amplitude: 1.3 V
Lead Channel Pacing Threshold Pulse Width: 0.4 ms
Lead Channel Pacing Threshold Pulse Width: 0.6 ms
Lead Channel Sensing Intrinsic Amplitude: 2.4 mV
Lead Channel Sensing Intrinsic Amplitude: 4.9 mV
Lead Channel Setting Pacing Amplitude: 2.4 V
Lead Channel Setting Pacing Amplitude: 2.8 V
Lead Channel Setting Pacing Pulse Width: 0.6 ms
Lead Channel Setting Sensing Sensitivity: 0.6 mV
Pulse Gen Serial Number: 112736

## 2021-09-28 LAB — T4, FREE: Free T4: 1.38 ng/dL (ref 0.82–1.77)

## 2021-09-28 LAB — MAGNESIUM: Magnesium: 2.1 mg/dL (ref 1.6–2.3)

## 2021-09-28 LAB — TSH: TSH: 1.55 u[IU]/mL (ref 0.450–4.500)

## 2021-09-28 MED ORDER — AMIODARONE HCL 200 MG PO TABS: 200.0000 mg | ORAL_TABLET | Freq: Every day | ORAL | 3 refills | Status: AC

## 2021-09-28 MED ORDER — AMIODARONE HCL 200 MG PO TABS
200.0000 mg | ORAL_TABLET | Freq: Every day | ORAL | 3 refills | Status: DC
Start: 2021-09-28 — End: 2021-09-28

## 2021-09-28 NOTE — Patient Instructions (Signed)
Medication Instructions:  Your physician recommends that you continue on your current medications as directed. Please refer to the Current Medication list given to you today.  *If you need a refill on your cardiac medications before your next appointment, please call your pharmacy*   Lab Work: TODAY: CMET, TSH, FreeT4, Mag  If you have labs (blood work) drawn today and your tests are completely normal, you will receive your results only by: MyChart Message (if you have MyChart) OR A paper copy in the mail If you have any lab test that is abnormal or we need to change your treatment, we will call you to review the results.  Follow-Up: At [facility], you and your health needs are our priority.  As part of our continuing mission to provide you with exceptional heart care, we have created designated Provider Care Teams.  These Care Teams include your primary Cardiologist (physician) and Advanced Practice Providers (APPs -  Physician Assistants and Nurse Practitioners) who all work together to provide you with the care you need, when you need it.   Your next appointment:   6 month(s)  The format for your next appointment:   In Person  Provider:   Sherryl Manges, MD{

## 2021-10-03 ENCOUNTER — Ambulatory Visit (INDEPENDENT_AMBULATORY_CARE_PROVIDER_SITE_OTHER): Payer: BLUE CROSS/BLUE SHIELD

## 2021-10-03 DIAGNOSIS — I495 Sick sinus syndrome: Secondary | ICD-10-CM

## 2021-10-03 LAB — CUP PACEART REMOTE DEVICE CHECK
Battery Remaining Longevity: 54 mo
Battery Remaining Percentage: 63 %
Brady Statistic RA Percent Paced: 25 %
Brady Statistic RV Percent Paced: 0 %
Date Time Interrogation Session: 20230807185200
HighPow Impedance: 47 Ohm
Implantable Lead Implant Date: 20011011
Implantable Lead Implant Date: 20160314
Implantable Lead Location: 753859
Implantable Lead Location: 753860
Implantable Lead Model: 148
Implantable Lead Model: 5076
Implantable Lead Serial Number: 111569
Implantable Pulse Generator Implant Date: 20160314
Lead Channel Impedance Value: 598 Ohm
Lead Channel Impedance Value: 787 Ohm
Lead Channel Pacing Threshold Amplitude: 0.8 V
Lead Channel Pacing Threshold Amplitude: 1.3 V
Lead Channel Pacing Threshold Pulse Width: 0.4 ms
Lead Channel Pacing Threshold Pulse Width: 0.6 ms
Lead Channel Setting Pacing Amplitude: 2.4 V
Lead Channel Setting Pacing Amplitude: 2.8 V
Lead Channel Setting Pacing Pulse Width: 0.6 ms
Lead Channel Setting Sensing Sensitivity: 0.6 mV
Pulse Gen Serial Number: 112736

## 2021-11-07 NOTE — Progress Notes (Signed)
Remote ICD transmission.   

## 2021-11-14 ENCOUNTER — Telehealth: Payer: Self-pay | Admitting: Internal Medicine

## 2021-11-14 NOTE — Telephone Encounter (Signed)
Left another message requesting call back to device clinic.  WHEN PATIENT CALLS BACK SEND TO DEVICE CLINIC

## 2021-11-14 NOTE — Telephone Encounter (Signed)
  Pt said, he called [employer] for his ICD and he was told it needs to be updated he is requesting to speak with RN Rosann Auerbach to discuss

## 2021-11-14 NOTE — Telephone Encounter (Signed)
Patient reports that he spoke with the device office and they told him to do an update to his software when he returns home.  Patient would like a callback as he has additional questions regarding having the battery in his pacemaker switched out.

## 2021-11-14 NOTE — Telephone Encounter (Signed)
Left detailed message for Pt.  Advised he had received a call from [employer] because his remote monitor is not connected.  Requested call back to device clinic to send remote transmission.

## 2021-11-16 NOTE — Telephone Encounter (Signed)
Left detailed message for Pt requesting call back to device clinic.  Pt overdue for remote transmission.

## 2021-11-17 NOTE — Telephone Encounter (Signed)
Attempted to contact patient. No answer,LMTCB 

## 2021-11-23 NOTE — Telephone Encounter (Signed)
Due to inability to reach patient, mailed a letter notifying to contact us immediately for remote transmission connection.

## 2022-01-02 ENCOUNTER — Telehealth: Payer: Self-pay | Admitting: Internal Medicine

## 2022-01-02 NOTE — Telephone Encounter (Signed)
LMOVM for patient to call the device clinic. He will need to come to the office every 3 months to get his ICD interrogated if he can not do remote monitoring.

## 2022-01-02 NOTE — Telephone Encounter (Signed)
Pt called stating he has a lot medical bills and he cannot send another device transmission. Please advise.

## 2022-01-08 NOTE — Telephone Encounter (Signed)
I spoke with the patient and explained to him that if he choose not to do remote monitoring we will need to see him in the office every 3 months to interrogate his device. He states he lives in [place] and his insurance does not pay for the doctor visits. He states he is looking for a doctor in [place]. I told him when he find one to let us know but mean while if he get shock and do not feel well to go to the ER. If he get shocked multiple times to go to the ER.  I told him the reason why we would want him to come into the office is because if he get Therapy by the device and do not feel it, without the monitor we will not know. The patient verbalized understanding. I canceled his upcoming remotes.

## 2022-01-26 ENCOUNTER — Telehealth: Payer: Self-pay

## 2022-01-26 NOTE — Telephone Encounter (Signed)
Patient called in stating he now lives in [place] and he is in the process of switching cardiologist. Patient is also having financial issues and is not able to pay for the remotes. Patient wants to cancel remotes until his care is switched

## 2022-01-29 NOTE — Telephone Encounter (Signed)
Pt called to get Dr. Graciela Husbands advice on some good Cardiologist in the [facility] are, who is in network with his insurance. His phone number is [phone].

## 2022-01-30 NOTE — Telephone Encounter (Signed)
PT left another voicemail about wanting to know who would be a good cardiologist in the [place] area. I left a voicemail letting him know we did get his message. Once Dr Graciela Husbands let us know, we will give him a call back.

## 2022-01-31 NOTE — Telephone Encounter (Signed)
Spoke with pt and advised per Dr Graciela Husbands he is not familiar with any of the cardiologist in the [place] area and cannot make a recommendation.  Pt verbalizes understanding and thanked Charity fundraiser for the call.

## 2022-02-12 ENCOUNTER — Other Ambulatory Visit: Payer: Self-pay | Admitting: Internal Medicine

## 2022-02-12 DIAGNOSIS — I472 Ventricular tachycardia, unspecified: Secondary | ICD-10-CM

## 2022-02-13 NOTE — Telephone Encounter (Signed)
Patient Pharmacy is requesting refill for Sotalol HCI 80 mg. Last filled on 01/09/21. Note in epic stated patient is moving to [place], [place]. Last seen Otilio Saber on 09/28/21. Haven't seen Dr. Graciela Husbands since 2021. Please advise.n Thank you

## 2022-05-08 ENCOUNTER — Telehealth: Payer: Self-pay | Admitting: Internal Medicine

## 2022-05-08 NOTE — Telephone Encounter (Signed)
Patient states he is trying to find a doctor in [place], [place] and would like a call back to discuss a referral he can get from Dr. Caryl Comes. Requesting call back from [place], [place].

## 2022-05-15 NOTE — Telephone Encounter (Signed)
Spoke with pt who states he has moved to [place] and is trying to establish with cardiology in the [place] area.  His insurance will no longer cover our practice in [place]. He was referred to someone by his new PCP but the wait time is about 4 months.  Pt advised 3+ months is the average wait time for new patient appointments.  Pt advised Dr Caryl Comes is not aware of cardiology providers in [place] area.  Pt states he will discuss again with his PCP and will let us know once he establishes with someone.

## 2022-12-05 ENCOUNTER — Other Ambulatory Visit: Payer: Self-pay | Admitting: Internal Medicine

## 2022-12-05 DIAGNOSIS — I472 Ventricular tachycardia, unspecified: Secondary | ICD-10-CM

## 2023-04-10 ENCOUNTER — Other Ambulatory Visit: Payer: Self-pay | Admitting: Internal Medicine

## 2023-04-10 DIAGNOSIS — I472 Ventricular tachycardia, unspecified: Secondary | ICD-10-CM

## 2023-04-19 ENCOUNTER — Other Ambulatory Visit: Payer: Self-pay | Admitting: Internal Medicine

## 2023-04-19 DIAGNOSIS — I472 Ventricular tachycardia, unspecified: Secondary | ICD-10-CM
